# Patient Record
Sex: Female | Born: 1985 | Race: White | Hispanic: No | Marital: Single | Smoking: Current every day smoker
Health system: Southern US, Community
[De-identification: ages and names within clinical notes are randomized; demographics above are authoritative.]

## PROBLEM LIST (undated history)

## (undated) DIAGNOSIS — F419 Anxiety disorder, unspecified: Secondary | ICD-10-CM

## (undated) DIAGNOSIS — K219 Gastro-esophageal reflux disease without esophagitis: Secondary | ICD-10-CM

## (undated) DIAGNOSIS — F329 Major depressive disorder, single episode, unspecified: Secondary | ICD-10-CM

## (undated) DIAGNOSIS — J45909 Unspecified asthma, uncomplicated: Secondary | ICD-10-CM

## (undated) DIAGNOSIS — F32A Depression, unspecified: Secondary | ICD-10-CM

## (undated) HISTORY — PX: TONSILLECTOMY: SUR1361

## (undated) HISTORY — PX: WISDOM TOOTH EXTRACTION: SHX21

## (undated) HISTORY — PX: BACK SURGERY: SHX140

---

## 2005-03-21 ENCOUNTER — Emergency Department (HOSPITAL_COMMUNITY): Admission: EM | Admit: 2005-03-21 | Discharge: 2005-03-21 | Payer: Self-pay | Admitting: Emergency Medicine

## 2005-03-23 ENCOUNTER — Emergency Department (HOSPITAL_COMMUNITY): Admission: EM | Admit: 2005-03-23 | Discharge: 2005-03-23 | Payer: Self-pay | Admitting: Emergency Medicine

## 2005-04-10 ENCOUNTER — Ambulatory Visit (HOSPITAL_COMMUNITY): Admission: RE | Admit: 2005-04-10 | Discharge: 2005-04-11 | Payer: Self-pay | Admitting: Neurosurgery

## 2005-08-30 ENCOUNTER — Emergency Department (HOSPITAL_COMMUNITY): Admission: EM | Admit: 2005-08-30 | Discharge: 2005-08-30 | Payer: Self-pay | Admitting: Emergency Medicine

## 2006-02-17 ENCOUNTER — Emergency Department (HOSPITAL_COMMUNITY): Admission: EM | Admit: 2006-02-17 | Discharge: 2006-02-17 | Payer: Self-pay | Admitting: Emergency Medicine

## 2009-03-30 ENCOUNTER — Inpatient Hospital Stay: Payer: Self-pay | Admitting: Unknown Physician Specialty

## 2014-06-21 ENCOUNTER — Encounter (HOSPITAL_COMMUNITY): Payer: Self-pay | Admitting: Emergency Medicine

## 2014-06-21 ENCOUNTER — Emergency Department (HOSPITAL_COMMUNITY)
Admission: EM | Admit: 2014-06-21 | Discharge: 2014-06-21 | Disposition: A | Discharge status: Home / Self Care | Payer: Commercial Managed Care - PPO | Attending: Emergency Medicine | Admitting: Emergency Medicine

## 2014-06-21 DIAGNOSIS — S61452A Open bite of left hand, initial encounter: Secondary | ICD-10-CM

## 2014-06-21 DIAGNOSIS — W540XXA Bitten by dog, initial encounter: Secondary | ICD-10-CM | POA: Insufficient documentation

## 2014-06-21 DIAGNOSIS — F17200 Nicotine dependence, unspecified, uncomplicated: Secondary | ICD-10-CM | POA: Insufficient documentation

## 2014-06-21 DIAGNOSIS — Z23 Encounter for immunization: Secondary | ICD-10-CM | POA: Insufficient documentation

## 2014-06-21 DIAGNOSIS — S01409A Unspecified open wound of unspecified cheek and temporomandibular area, initial encounter: Secondary | ICD-10-CM | POA: Insufficient documentation

## 2014-06-21 DIAGNOSIS — S0185XA Open bite of other part of head, initial encounter: Secondary | ICD-10-CM

## 2014-06-21 DIAGNOSIS — Z880 Allergy status to penicillin: Secondary | ICD-10-CM | POA: Insufficient documentation

## 2014-06-21 DIAGNOSIS — Y929 Unspecified place or not applicable: Secondary | ICD-10-CM | POA: Insufficient documentation

## 2014-06-21 DIAGNOSIS — Y9389 Activity, other specified: Secondary | ICD-10-CM | POA: Insufficient documentation

## 2014-06-21 DIAGNOSIS — S61409A Unspecified open wound of unspecified hand, initial encounter: Secondary | ICD-10-CM | POA: Insufficient documentation

## 2014-06-21 MED ORDER — POVIDONE-IODINE 10 % EX SOLN
CUTANEOUS | Status: AC
  Filled 2014-06-21: qty 118

## 2014-06-21 MED ORDER — LIDOCAINE-EPINEPHRINE (PF) 2 %-1:200000 IJ SOLN
INTRAMUSCULAR | Status: AC
  Administered 2014-06-21: 20 mL
  Filled 2014-06-21: qty 20

## 2014-06-21 MED ORDER — TETANUS-DIPHTH-ACELL PERTUSSIS 5-2.5-18.5 LF-MCG/0.5 IM SUSP
0.5000 mL | Freq: Once | INTRAMUSCULAR | Status: AC
  Administered 2014-06-21: 0.5 mL via INTRAMUSCULAR
  Filled 2014-06-21: qty 0.5

## 2014-06-21 MED ORDER — CIPROFLOXACIN HCL 250 MG PO TABS
500.0000 mg | ORAL_TABLET | Freq: Once | ORAL | Status: DC
  Filled 2014-06-21: qty 2

## 2014-06-21 MED ORDER — OXYCODONE-ACETAMINOPHEN 5-325 MG PO TABS: 2.0000 | ORAL_TABLET | ORAL | Status: DC | PRN

## 2014-06-21 MED ORDER — CIPROFLOXACIN HCL 500 MG PO TABS: 500.0000 mg | ORAL_TABLET | Freq: Two times a day (BID) | ORAL | Status: DC

## 2014-06-21 MED ORDER — CLINDAMYCIN HCL 150 MG PO CAPS
300.0000 mg | ORAL_CAPSULE | Freq: Once | ORAL | Status: DC
  Filled 2014-06-21: qty 2

## 2014-06-21 MED ORDER — LIDOCAINE-EPINEPHRINE (PF) 2 %-1:200000 IJ SOLN
20.0000 mL | Freq: Once | INTRAMUSCULAR | Status: AC
  Administered 2014-06-21: 20 mL

## 2014-06-21 MED ORDER — CIPROFLOXACIN HCL 250 MG PO TABS
500.0000 mg | ORAL_TABLET | Freq: Once | ORAL | Status: AC
  Administered 2014-06-21: 500 mg via ORAL
  Filled 2014-06-21: qty 2

## 2014-06-21 MED ORDER — CLINDAMYCIN HCL 150 MG PO CAPS
300.0000 mg | ORAL_CAPSULE | Freq: Once | ORAL | Status: AC
  Administered 2014-06-21: 300 mg via ORAL
  Filled 2014-06-21: qty 2

## 2014-06-21 MED ORDER — CLINDAMYCIN HCL 300 MG PO CAPS: 300.0000 mg | ORAL_CAPSULE | Freq: Four times a day (QID) | ORAL | Status: DC

## 2014-06-21 MED ORDER — METRONIDAZOLE 500 MG PO TABS: 500.0000 mg | ORAL_TABLET | Freq: Once | ORAL | Status: DC

## 2014-06-21 NOTE — ED Provider Notes (Signed)
CSN: [id number]     Arrival date & time 06/21/14  1920 History  This chart was scribed for Donnetta Hutching, MD by Milly Jakob, ED Scribe. The patient was seen in room APA17/APA17. Patient's care was started at 8:00 PM.     Chief Complaint  Patient presents with  . Animal Bite   The history is provided by the patient. No language interpreter was used.   HPI Comments: JOURI THREAT is a 28 y.o. female who presents to the Emergency Department complaining of a dog bite onset 1 hour ago. She reports that she has a elliptical laceration on her right cheek, two minor lacerations minor to her right eye, and two miner lacerations on the 2nd and 3rd digits of her left hand. She reports that her Tetanus is not UTD. She denies any chronic illness.   History reviewed. No pertinent past medical history. Past Surgical History  Procedure Laterality Date  . Back surgery     No family history on file. History  Substance Use Topics  . Smoking status: Current Every Day Smoker  . Smokeless tobacco: Not on file  . Alcohol Use: Yes   OB History   Grav Para Term Preterm Abortions TAB SAB Ect Mult Living                 Review of Systems    Allergies  Penicillins  Home Medications   Prior to Admission medications   Medication Sig Start Date End Date Taking? Authorizing Provider  ciprofloxacin (CIPRO) 500 MG tablet Take 1 tablet (500 mg total) by mouth 2 (two) times daily. 06/21/14   Donnetta Hutching, MD  clindamycin (CLEOCIN) 300 MG capsule Take 1 capsule (300 mg total) by mouth 4 (four) times daily. X 7 days 06/21/14   Donnetta Hutching, MD  oxyCODONE-acetaminophen (PERCOCET) 5-325 MG per tablet Take 2 tablets by mouth every 4 (four) hours as needed. 06/21/14   Donnetta Hutching, MD   Triage Vitals: BP 144/82  Pulse 114  Resp 18  Ht 5\' 4"  (1.626 m)  Wt 208 lb (94.348 kg)  BMI 35.69 kg/m2  SpO2 99%  LMP 06/07/2014 Physical Exam  Nursing note and vitals reviewed. Constitutional: She is oriented to person,  place, and time. She appears well-developed and well-nourished. No distress.  HENT:  Head: Normocephalic and atraumatic.  Eyes: Conjunctivae and EOM are normal.  Neck: Neck supple. No tracheal deviation present.  Cardiovascular: Normal rate.   Pulmonary/Chest: Effort normal. No respiratory distress.  Musculoskeletal: Normal range of motion.  Neurological: She is alert and oriented to person, place, and time.  Skin: Skin is warm and dry.  Elliptical 2.75 cm laceration inferior to her eye on her right cheek. Minor to her right eye there are 2 semicircular lacerations. On the DIP of her 2nd and 3rd digits on her left hand she has two minor lacerations.   Psychiatric: She has a normal mood and affect. Her behavior is normal.    ED Course  Procedures (including critical care time) DIAGNOSTIC STUDIES: Oxygen Saturation is 99% on room air, normal by my interpretation.    COORDINATION OF CARE: 8:06 PM-Discussed treatment plan which includes laceration repair with pt at bedside and pt agreed to plan.   LACERATION REPAIR PROCEDURE NOTE The patient's identification was confirmed and consent was obtained. This procedure was performed by No att. providers found at 4:10 PM. Site: Elliptical laceration inferior to her eye on her right cheek.  Sterile procedures observed Anesthetic used (type  and amt): 2% Lidocaine w/ Epinephrine 12/1998 Suture type/size: 6-0 / Ethilon  Length: 2.75 cm # of Sutures: 6 Technique: Simple Inturrupted   Complexity: Severe Antibx ointment applied Tetanus ordered Site anesthetized, irrigated with NS extensivley, explored without evidence of foreign body, wound well approximated, site covered with dry, sterile dressing.  Patient tolerated procedure well without complications. Instructions for care discussed verbally and patient provided with additional written instructions for homecare and f/u.  Labs Review Labs Reviewed - No data to display  Imaging Review No  results found.   EKG Interpretation None      MDM   Final diagnoses:  Dog bite of face, initial encounter  Dog bite of left hand, initial encounter    Facial wound closed loosely secondary to infection potential.  This was disc c pt and her mother.  Tetanus, antibiotics, recheck two days.  I personally performed the services described in this documentation, which was scribed in my presence. The recorded information has been reviewed and is accurate.    Donnetta HutchingBrian Aliyyah Riese, MD 06/28/14 [phone]

## 2014-06-21 NOTE — Discharge Instructions (Signed)
Animal Bite Animal bite wounds can get infected. It is important to get proper medical treatment. Ask your doctor if you need a rabies shot. HOME CARE   Follow your doctor's instructions for taking care of your wound.  Only take medicine as told by your doctor.  Take your medicine (antibiotics) as told. Finish them even if you start to feel better.  Keep all doctor visits as told. You may need a tetanus shot if:   You cannot remember when you had your last tetanus shot.  You have never had a tetanus shot.  The injury broke your skin. If you need a tetanus shot and you choose not to have one, you may get tetanus. Sickness from tetanus can be serious. GET HELP RIGHT AWAY IF:   Your wound is warm, red, sore, or puffy (swollen).  You notice yellowish-white fluid (pus) or a bad smell coming from the wound.  You see a red line on the skin coming from the wound.  You have a fever, chills, or you feel sick.  You feel sick to your stomach (nauseous), or you throw up (vomit).  Your pain does not go away, or it gets worse.  You have trouble moving the injured part.  You have questions or concerns. MAKE SURE YOU:   Understand these instructions.  Will watch your condition.  Will get help right away if you are not doing well or get worse. Document Released: 11/23/2005 Document Revised: 02/15/2012 Document Reviewed: 07/15/2011 [facility] Patient Information 2015 [place], [place]. This information is not intended to replace advice given to you by your health care provider. Make sure you discuss any questions you have with your health care provider.   Recheck here in Saturday morning. Leave dressing on until then. There is a possibility for infection. Return sooner for fever or chills. 2 different antibiotics: Cipro 500 mg twice a day and clindamycin 300 mg 4 times a day. Take a dose of the chin the middle the night. Pain medication.

## 2014-06-21 NOTE — ED Notes (Signed)
Pt's is owner of dog that got in a fight with mother's dog, pt went to pull her dog off and her dog came around to pt's face, lace noted under right eye and also states bit to fingers of left hand, pt states dog is up to date on shots, [place] [facility] [place] be [place]

## 2014-06-21 NOTE — ED Notes (Signed)
Dogs got into a fight and I got in the middle of them. Dog bite to the face and left hand. Dogs live in the same home and have been vaccinated per pt. Laceration noted under right eye in cheek area. Puncture wound noted beside right eyebrow. Lacerations noted to left 2nd and 3rd fingers.

## 2014-06-21 NOTE — ED Notes (Signed)
Discharge instructions given and reviewed with patient.  Prescriptions given for Percocet, Cipro and Clindamycin; effects and use explained.  Patient verbalized understanding of sedating effects of Percocet and to complete all antibiotic.  Patient also verbalized understanding to return on Saturday for a wound check.

## 2014-06-23 ENCOUNTER — Emergency Department (HOSPITAL_COMMUNITY)
Admission: EM | Admit: 2014-06-23 | Discharge: 2014-06-23 | Disposition: A | Discharge status: Home / Self Care | Payer: Commercial Managed Care - PPO | Attending: Emergency Medicine | Admitting: Emergency Medicine

## 2014-06-23 ENCOUNTER — Encounter (HOSPITAL_COMMUNITY): Payer: Self-pay | Admitting: Emergency Medicine

## 2014-06-23 DIAGNOSIS — F17200 Nicotine dependence, unspecified, uncomplicated: Secondary | ICD-10-CM | POA: Insufficient documentation

## 2014-06-23 DIAGNOSIS — Z4801 Encounter for change or removal of surgical wound dressing: Secondary | ICD-10-CM | POA: Insufficient documentation

## 2014-06-23 DIAGNOSIS — Z792 Long term (current) use of antibiotics: Secondary | ICD-10-CM | POA: Insufficient documentation

## 2014-06-23 DIAGNOSIS — S0185XD Open bite of other part of head, subsequent encounter: Secondary | ICD-10-CM

## 2014-06-23 DIAGNOSIS — Z880 Allergy status to penicillin: Secondary | ICD-10-CM | POA: Insufficient documentation

## 2014-06-23 NOTE — ED Notes (Signed)
Patient with no complaints at this time. Respirations even and unlabored. Skin warm/dry. Discharge instructions reviewed with patient at this time. Patient given opportunity to voice concerns/ask questions. Patient discharged at this time and left Emergency Department with steady gait.   

## 2014-06-23 NOTE — ED Notes (Signed)
Pt presents today for recheck for wound to right cheek, pt states that she was received a laceration while breaking up a dog fight, denies any problems with stitches, states that the area is "looking better", pt has sweling, bruising noted around right eye.

## 2014-06-23 NOTE — ED Provider Notes (Signed)
CSN: [id number]     Arrival date & time 06/23/14  1326 History   First MD Initiated Contact with Patient 06/23/14 1407     Chief Complaint  Patient presents with  . Follow-up     (Consider location/radiation/quality/duration/timing/severity/associated sxs/prior Treatment) HPI Comments: Amanda Morales is a 28 y.o. female who presents to the Emergency Department requesting recheck of a laceration to her right cheek that was secondary to a dog bite.  She states the pain is improving, she denies visual changes, fever, chills, redness or drainage.  She has bruising and swelling of the cheek, but states it is no worse than her previous visit.    The history is provided by the patient.    History reviewed. No pertinent past medical history. Past Surgical History  Procedure Laterality Date  . Back surgery     No family history on file. History  Substance Use Topics  . Smoking status: Current Every Day Smoker  . Smokeless tobacco: Not on file  . Alcohol Use: Yes   OB History   Grav Para Term Preterm Abortions TAB SAB Ect Mult Living                 Review of Systems  Constitutional: Negative for fever and chills.  HENT: Negative for trouble swallowing and voice change.   Musculoskeletal: Negative for arthralgias, back pain, joint swelling and neck pain.  Skin: Positive for wound.       Laceration right face  Neurological: Positive for facial asymmetry. Negative for dizziness, syncope, weakness, numbness and headaches.  Hematological: Does not bruise/bleed easily.  All other systems reviewed and are negative.     Allergies  Penicillins  Home Medications   Prior to Admission medications   Medication Sig Start Date End Date Taking? Authorizing Provider  ciprofloxacin (CIPRO) 500 MG tablet Take 1 tablet (500 mg total) by mouth 2 (two) times daily. 06/21/14  Yes Donnetta Hutching, MD  clindamycin (CLEOCIN) 300 MG capsule Take 1 capsule (300 mg total) by mouth 4 (four) times daily. X  7 days 06/21/14  Yes Donnetta Hutching, MD  oxyCODONE-acetaminophen (PERCOCET) 5-325 MG per tablet Take 2 tablets by mouth every 4 (four) hours as needed. 06/21/14  Yes Donnetta Hutching, MD   BP 125/75  Pulse 80  Temp(Src) 98.1 F (36.7 C) (Oral)  Resp 16  SpO2 99%  LMP 06/07/2014 Physical Exam  Nursing note and vitals reviewed. Constitutional: She is oriented to person, place, and time. She appears well-developed and well-nourished. No distress.  HENT:  Head: Normocephalic and atraumatic.    Right Ear: Tympanic membrane and ear canal normal.  Left Ear: Tympanic membrane and ear canal normal.  Semi circular laceration to the right cheek.  Sutures intact.  Wound appears to be healing well.  No erythema or drainage present.  Pt has mild bruising to the right periorbital region.    Eyes: EOM are normal. Pupils are equal, round, and reactive to light.  Neck: Normal range of motion. Neck supple.  Cardiovascular: Normal rate, regular rhythm, normal heart sounds and intact distal pulses.   No murmur heard. Pulmonary/Chest: Effort normal and breath sounds normal. No respiratory distress.  Musculoskeletal: Normal range of motion. She exhibits no edema and no tenderness.  Small scratches to the left second and third fingers.  Wounds appear well approximated and clean.  No erythema or edema.  Pt has full ROM of the fingers.  Distal sensation intact  Lymphadenopathy:    She has no  cervical adenopathy.  Neurological: She is alert and oriented to person, place, and time. She exhibits normal muscle tone. Coordination normal.  Skin: Skin is warm. Laceration noted.  See HENT exam    ED Course  Procedures (including critical care time) Labs Review Labs Reviewed - No data to display  Imaging Review No results found.   EKG Interpretation None      MDM   Final diagnoses:  Animal bite of face, subsequent encounter    Previous ed chart reviewed.  Laceration to right cheek appears to be healing well.   No significant edema.  Pain improved.  No concerning sx's for infection at this time.  Pt is currently taking cipro and clindamycin.  She appears stable for d/c and agrees to f/u with her PMD if needed or to return here      Myrah Strawderman L. Trisha Mangleriplett, PA-C 06/24/14 1802

## 2014-06-25 MED FILL — Oxycodone w/ Acetaminophen Tab 5-325 MG: ORAL | Qty: 6 | Status: AC

## 2014-06-25 NOTE — ED Provider Notes (Signed)
History/physical exam/procedure(s) were performed by non-physician practitioner and as supervising physician I was immediately available for consultation/collaboration. I have reviewed all notes and am in agreement with care and plan.   Saharsh Sterling S Staphany Ditton, MD 06/25/14 1458 

## 2015-09-12 ENCOUNTER — Emergency Department (HOSPITAL_COMMUNITY)
Admission: EM | Admit: 2015-09-12 | Discharge: 2015-09-12 | Disposition: A | Discharge status: Home / Self Care | Payer: Commercial Managed Care - PPO | Attending: Emergency Medicine | Admitting: Emergency Medicine

## 2015-09-12 ENCOUNTER — Encounter (HOSPITAL_COMMUNITY): Payer: Self-pay | Admitting: *Deleted

## 2015-09-12 ENCOUNTER — Emergency Department (HOSPITAL_COMMUNITY): Payer: Commercial Managed Care - PPO

## 2015-09-12 DIAGNOSIS — X58XXXA Exposure to other specified factors, initial encounter: Secondary | ICD-10-CM | POA: Insufficient documentation

## 2015-09-12 DIAGNOSIS — Z880 Allergy status to penicillin: Secondary | ICD-10-CM | POA: Diagnosis not present

## 2015-09-12 DIAGNOSIS — S46912A Strain of unspecified muscle, fascia and tendon at shoulder and upper arm level, left arm, initial encounter: Secondary | ICD-10-CM | POA: Insufficient documentation

## 2015-09-12 DIAGNOSIS — Y9389 Activity, other specified: Secondary | ICD-10-CM | POA: Diagnosis not present

## 2015-09-12 DIAGNOSIS — Z720 Tobacco use: Secondary | ICD-10-CM | POA: Diagnosis not present

## 2015-09-12 DIAGNOSIS — Y9289 Other specified places as the place of occurrence of the external cause: Secondary | ICD-10-CM | POA: Diagnosis not present

## 2015-09-12 DIAGNOSIS — S4992XA Unspecified injury of left shoulder and upper arm, initial encounter: Secondary | ICD-10-CM | POA: Diagnosis present

## 2015-09-12 DIAGNOSIS — Y998 Other external cause status: Secondary | ICD-10-CM | POA: Insufficient documentation

## 2015-09-12 MED ORDER — HYDROCODONE-ACETAMINOPHEN 5-325 MG PO TABS: ORAL_TABLET | ORAL | Status: DC

## 2015-09-12 NOTE — ED Notes (Signed)
Patient c/o pain in left shoulder x 2 weeks. 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

## 2015-09-14 NOTE — ED Provider Notes (Signed)
CSN: [id number]     Arrival date & time 09/12/15  1024 History   First MD Initiated Contact with Patient 09/12/15 1102     Chief Complaint  Patient presents with  . Shoulder Pain     (Consider location/radiation/quality/duration/timing/severity/associated sxs/prior Treatment) HPI   Amanda Morales is a 29 y.o. female who presents to the Emergency Department complaining of left shoulder for 2 weeks.  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  Thinks she has "pulled a muscle"  She has been taking OTC medications without relief.  Pain is worse with left arm movement and improves with arm  at rest.  She denies numbness or weakness, neck or chest pain, fever, headaches, or swelling.   History reviewed. No pertinent past medical history. Past Surgical History  Procedure Laterality Date  . Back surgery     History reviewed. No pertinent family history. Social History  Substance Use Topics  . Smoking status: Current Every Day Smoker  . Smokeless tobacco: None  . Alcohol Use: Yes   OB History    No data available     Review of Systems  Constitutional: Negative for fever and chills.  Gastrointestinal: Negative for nausea, vomiting and abdominal pain.  Genitourinary: Negative for dysuria and difficulty urinating.  Musculoskeletal: Positive for arthralgias (left shoulder). Negative for joint swelling, neck pain and neck stiffness.  Skin: Negative for color change and wound.  Neurological: Negative for weakness, numbness and headaches.  All other systems reviewed and are negative.     Allergies  Penicillins  Home Medications   Prior to Admission medications   Medication Sig Start Date End Date Taking? Authorizing Provider  ibuprofen (ADVIL,MOTRIN) 200 MG tablet Take 800 mg by mouth every 6 (six) hours as needed.   Yes Historical Provider, MD  HYDROcodone-acetaminophen (NORCO/VICODIN) 5-325 MG tablet Take one-two tabs po q 4-6 hrs prn pain  09/12/15   Makina Skow, PA-C   BP 160/88 mmHg  Pulse 74  Temp(Src) 98.1 F (36.7 C) (Oral)  Resp 16  Ht  (1.575 m)  Wt 215 lb (97.523 kg)  BMI 39.31 kg/m2  SpO2 100% Physical Exam  Constitutional: She is oriented to person, place, and time. She appears well-developed and well-nourished. No distress.  HENT:  Head: Normocephalic and atraumatic.  Neck: Normal range of motion. Neck supple. No thyromegaly present.  Cardiovascular: Normal rate, regular rhythm, normal heart sounds and intact distal pulses.   No murmur heard. Pulmonary/Chest: Effort normal and breath sounds normal. No respiratory distress. She exhibits no tenderness.  Musculoskeletal: She exhibits tenderness. She exhibits no edema.  ttp of the left anterior shoulder.  Pain with abduction of the left arm and rotation of the shoulder.  Radial pulse is brisk, distal sensation intact, CR< 2 sec. Grip strength is strong and symmetrical.   No abrasions, edema , erythema or step-off deformity of the joint.   Lymphadenopathy:    She has no cervical adenopathy.  Neurological: She is alert and oriented to person, place, and time. She has normal strength. No sensory deficit. She exhibits normal muscle tone. Coordination normal.  Skin: Skin is warm and dry.  Nursing note and vitals reviewed.   ED Course  Procedures (including critical care time) Labs Review Labs Reviewed - No data to display  Imaging Review Dg Shoulder Left  09/12/2015   CLINICAL DATA:  Left shoulder pain for 2 weeks without known injury.  EXAM: LEFT SHOULDER -  2+ VIEW  COMPARISON:  None.  FINDINGS: There is no evidence of fracture or dislocation. There is no evidence of arthropathy or other focal bone abnormality. Soft tissues are unremarkable.  IMPRESSION: Normal left shoulder.   Electronically Signed   By: Lupita Raider, M.D.   On: 09/12/2015 11:26   I have personally reviewed and evaluated these images and lab results as part of my medical  decision-making.   EKG Interpretation None      MDM   Final diagnoses:  Shoulder strain, left, initial encounter    Pt is well appearing.  Non-toxic appearing.  Sx's c/w shoulder strain. No cervical tenderness.   No concerning sx's for septic joint.  NV intact.  She agrees to symptomatic tx , continue NSAID and orthopedic f/u in one week if not improving.      Pauline Aus, PA-C 09/14/15 2155  Lavera Guise, MD 09/16/15 [phone]

## 2016-05-03 ENCOUNTER — Encounter: Payer: Self-pay | Admitting: Emergency Medicine

## 2016-05-03 ENCOUNTER — Emergency Department
Admission: EM | Admit: 2016-05-03 | Discharge: 2016-05-04 | Disposition: A | Discharge status: Home / Self Care | Payer: Commercial Managed Care - PPO | Attending: Emergency Medicine | Admitting: Emergency Medicine

## 2016-05-03 ENCOUNTER — Emergency Department: Payer: Commercial Managed Care - PPO

## 2016-05-03 DIAGNOSIS — F17210 Nicotine dependence, cigarettes, uncomplicated: Secondary | ICD-10-CM | POA: Insufficient documentation

## 2016-05-03 DIAGNOSIS — N3001 Acute cystitis with hematuria: Secondary | ICD-10-CM | POA: Diagnosis not present

## 2016-05-03 DIAGNOSIS — R109 Unspecified abdominal pain: Secondary | ICD-10-CM

## 2016-05-03 LAB — POCT PREGNANCY, URINE: PREG TEST UR: NEGATIVE

## 2016-05-03 LAB — URINALYSIS COMPLETE WITH MICROSCOPIC (ARMC ONLY)
BACTERIA UA: NONE SEEN
Bilirubin Urine: NEGATIVE
Glucose, UA: NEGATIVE mg/dL
Nitrite: NEGATIVE
PH: 5 (ref 5.0–8.0)
Protein, ur: NEGATIVE mg/dL
Specific Gravity, Urine: 1.023 (ref 1.005–1.030)

## 2016-05-03 MED ORDER — OXYCODONE-ACETAMINOPHEN 5-325 MG PO TABS
1.0000 | ORAL_TABLET | Freq: Once | ORAL | Status: AC
  Administered 2016-05-03: 1 via ORAL
  Filled 2016-05-03: qty 1

## 2016-05-03 MED ORDER — LIDOCAINE HCL (PF) 1 % IJ SOLN: 2.1000 mL | Freq: Once | INTRAMUSCULAR | Status: DC

## 2016-05-03 MED ORDER — DIAZEPAM 2 MG PO TABS
2.0000 mg | ORAL_TABLET | Freq: Once | ORAL | Status: AC
  Administered 2016-05-03: 2 mg via ORAL
  Filled 2016-05-03: qty 1

## 2016-05-03 MED ORDER — CEFTRIAXONE SODIUM 1 G IJ SOLR: 1.0000 g | INTRAMUSCULAR | Status: DC

## 2016-05-03 NOTE — ED Notes (Signed)
Patient transported to CT 

## 2016-05-03 NOTE — ED Notes (Signed)
POC urine NEGATIVE 

## 2016-05-03 NOTE — ED Notes (Addendum)
Pt arrived from work where she had to leave due to unbearable pain that began at 330. Pain began after waking up and is not worse with movement. Denies injury. Describes pain as a spasm that is localized to left lower back.

## 2016-05-03 NOTE — ED Provider Notes (Signed)
[facility] Emergency Department Provider Note ____________________________________________  Time seen: Approximately 10:33 PM  I have reviewed the triage vital signs and the nursing notes.   HISTORY  Chief Complaint Back Pain    HPI Amanda Morales is a 30 y.o. female who presents to the emergency department for evaluation of back pain. She states that she developed severe back pain this afternoon after awakening. She denies injury. She reports having back surgery in the past due to herniated discs. She has taken ibuprofen without relief. She denies radiation of the pain or numbness/tingling in the lower extremities. Pain is described as a spasm.   History reviewed. No pertinent past medical history.  There are no active problems to display for this patient.   Past Surgical History  Procedure Laterality Date  . Back surgery      Current Outpatient Rx  Name  Route  Sig  Dispense  Refill  . ciprofloxacin (CIPRO) 500 MG tablet   Oral   Take 1 tablet (500 mg total) by mouth 2 (two) times daily.   20 tablet   0   . oxyCODONE-acetaminophen (ROXICET) 5-325 MG tablet   Oral   Take 1 tablet by mouth every 6 (six) hours as needed.   9 tablet   0     Allergies Penicillins  No family history on file.  Social History Social History  Substance Use Topics  . Smoking status: Current Every Day Smoker -- 1.00 packs/day for 15 years    Types: Cigarettes  . Smokeless tobacco: None  . Alcohol Use: Yes    Review of Systems Constitutional: No recent illness. Cardiovascular: Denies chest pain or palpitations. Respiratory: Denies shortness of breath. Musculoskeletal: Pain in left lower back.  Skin: Negative for rash, wound, lesion. Neurological: Negative for focal weakness or numbness.  ____________________________________________   PHYSICAL EXAM:  VITAL SIGNS: ED Triage Vitals  Enc Vitals Group     BP 05/03/16 2228 146/97 mmHg     Pulse Rate  05/03/16 2228 85     Resp 05/03/16 2228 20     Temp 05/03/16 2228 98.2 F (36.8 C)     Temp Source 05/03/16 2228 Oral     SpO2 05/03/16 2228 98 %     Weight 05/03/16 2228 203 lb (92.08 kg)     Height 05/03/16 2228  (1.575 m)     Head Cir --      Peak Flow --      Pain Score 05/03/16 2228 9     Pain Loc --      Pain Edu? --      Excl. in GC? --     Constitutional: Alert and oriented. Well appearing and in no acute distress. Eyes: Conjunctivae are normal. EOMI. Head: Atraumatic. Neck: No stridor.  Respiratory: Normal respiratory effort.   Musculoskeletal: Limited ROM of back due to pain. Pain mainly on left in the lumbar area.  Neurologic:  Normal speech and language. No gross focal neurologic deficits are appreciated. Speech is normal. No gait instability. Skin:  Skin is warm, dry and intact. Atraumatic. Psychiatric: Mood and affect are normal. Speech and behavior are normal.  ____________________________________________   LABS (all labs ordered are listed, but only abnormal results are displayed)  Labs Reviewed  URINALYSIS COMPLETEWITH MICROSCOPIC ([facility] ONLY) - Abnormal; Notable for the following:    Color, Urine YELLOW (*)    APPearance CLEAR (*)    Ketones, ur 1+ (*)    Hgb urine  dipstick 2+ (*)    Leukocytes, UA 1+ (*)    Squamous Epithelial / LPF 0-5 (*)    All other components within normal limits  URINE CULTURE  POC URINE PREG, ED  POCT PREGNANCY, URINE   ____________________________________________  RADIOLOGY  No acute abnormality in the abdomen and pelvis, specifically no renal stones or obstructive uropathy per CT stone protocol. Hepatic steatosis is noted. ____________________________________________   PROCEDURES  Procedure(s) performed: None   ____________________________________________   INITIAL IMPRESSION / ASSESSMENT AND PLAN / ED COURSE  Pertinent labs & imaging results that were available during my care of the patient were reviewed  by me and considered in my medical decision making (see chart for details).  Patient was given IM Dilaudid and ciprofloxacin 500 mg by mouth prior to discharge. She is to call and schedule a follow-up appointment with urology if symptoms do not improve over the next 2-3 days. She was encouraged to return to the emergency room for symptoms change or worsen if unable schedule an appointment. ____________________________________________   FINAL CLINICAL IMPRESSION(S) / ED DIAGNOSES  Final diagnoses:  Acute left flank pain  Acute cystitis with hematuria       Chinita PesterCari B Jonae Renshaw, FNP 05/04/16 1903  Jeanmarie PlantJames A McShane, MD 05/08/16 [phone]

## 2016-05-04 MED ORDER — OXYCODONE-ACETAMINOPHEN 5-325 MG PO TABS: 1.0000 | ORAL_TABLET | Freq: Four times a day (QID) | ORAL | Status: DC | PRN

## 2016-05-04 MED ORDER — HYDROMORPHONE HCL 1 MG/ML IJ SOLN
1.0000 mg | Freq: Once | INTRAMUSCULAR | Status: AC
  Administered 2016-05-04: 1 mg via INTRAMUSCULAR
  Filled 2016-05-04: qty 1

## 2016-05-04 MED ORDER — CIPROFLOXACIN HCL 500 MG PO TABS
500.0000 mg | ORAL_TABLET | Freq: Once | ORAL | Status: AC
  Administered 2016-05-04: 500 mg via ORAL
  Filled 2016-05-04: qty 1

## 2016-05-04 MED ORDER — CIPROFLOXACIN HCL 500 MG PO TABS: 500.0000 mg | ORAL_TABLET | Freq: Two times a day (BID) | ORAL | Status: DC

## 2016-05-04 NOTE — Discharge Instructions (Signed)
Flank Pain °Flank pain refers to pain that is located on the side of the body between the upper abdomen and the back. The pain may occur over a short period of time (acute) or may be long-term or reoccurring (chronic). It may be mild or severe. Flank pain can be caused by many things. °CAUSES  °Some of the more common causes of flank pain include: °· Muscle strains.   °· Muscle spasms.   °· A disease of your spine (vertebral disk disease).   °· A lung infection (pneumonia).   °· Fluid around your lungs (pulmonary edema).   °· A kidney infection.   °· Kidney stones.   °· A very painful skin rash caused by the chickenpox virus (shingles).   °· Gallbladder disease.   °HOME CARE INSTRUCTIONS  °Home care will depend on the cause of your pain. In general, °· Rest as directed by your caregiver. °· Drink enough fluids to keep your urine clear or pale yellow. °· Only take over-the-counter or prescription medicines as directed by your caregiver. Some medicines may help relieve the pain. °· Tell your caregiver about any changes in your pain. °· Follow up with your caregiver as directed. °SEEK IMMEDIATE MEDICAL CARE IF:  °· Your pain is not controlled with medicine.   °· You have new or worsening symptoms. °· Your pain increases.   °· You have abdominal pain.   °· You have shortness of breath.   °· You have persistent nausea or vomiting.   °· You have swelling in your abdomen.   °· You feel faint or pass out.   °· You have blood in your urine. °· You have a fever or persistent symptoms for more than 2-3 days. °· You have a fever and your symptoms suddenly get worse. °MAKE SURE YOU:  °· Understand these instructions. °· Will watch your condition. °· Will get help right away if you are not doing well or get worse. °  °This information is not intended to replace advice given to you by your health care provider. Make sure you discuss any questions you have with your health care provider. °  °Document Released: 01/14/2006 Document  Revised: 08/17/2012 Document Reviewed: 07/07/2012 °Elsevier Interactive Patient Education ©2016 [employer] ° °

## 2016-05-05 LAB — URINE CULTURE: Special Requests: NORMAL

## 2018-01-08 ENCOUNTER — Encounter: Payer: Self-pay | Admitting: Emergency Medicine

## 2018-01-08 ENCOUNTER — Emergency Department
Admission: EM | Admit: 2018-01-08 | Discharge: 2018-01-08 | Disposition: A | Discharge status: Home / Self Care | Payer: Worker's Compensation | Attending: Emergency Medicine | Admitting: Emergency Medicine

## 2018-01-08 ENCOUNTER — Emergency Department: Payer: Worker's Compensation

## 2018-01-08 DIAGNOSIS — Z79899 Other long term (current) drug therapy: Secondary | ICD-10-CM | POA: Insufficient documentation

## 2018-01-08 DIAGNOSIS — M545 Low back pain: Secondary | ICD-10-CM | POA: Diagnosis not present

## 2018-01-08 DIAGNOSIS — F17210 Nicotine dependence, cigarettes, uncomplicated: Secondary | ICD-10-CM | POA: Diagnosis not present

## 2018-01-08 MED ORDER — CYCLOBENZAPRINE HCL 10 MG PO TABS
10.0000 mg | ORAL_TABLET | Freq: Once | ORAL | Status: AC
  Administered 2018-01-08: 10 mg via ORAL
  Filled 2018-01-08: qty 1

## 2018-01-08 MED ORDER — CYCLOBENZAPRINE HCL 10 MG PO TABS: 10.0000 mg | ORAL_TABLET | Freq: Three times a day (TID) | ORAL | 0 refills | Status: AC | PRN

## 2018-01-08 MED ORDER — KETOROLAC TROMETHAMINE 10 MG PO TABS: 10.0000 mg | ORAL_TABLET | Freq: Four times a day (QID) | ORAL | 0 refills | Status: AC | PRN

## 2018-01-08 MED ORDER — KETOROLAC TROMETHAMINE 30 MG/ML IJ SOLN
30.0000 mg | Freq: Once | INTRAMUSCULAR | Status: AC
  Administered 2018-01-08: 30 mg via INTRAMUSCULAR
  Filled 2018-01-08: qty 1

## 2018-01-08 NOTE — ED Provider Notes (Signed)
[facility] Emergency Department Provider Note  ____________________________________________  Time seen: Approximately 10:13 PM  I have reviewed the triage vital signs and the nursing notes.   HISTORY  Chief Complaint Back Pain and Fall    HPI Amanda Morales is a 32 y.o. female presents to the emergency department with low back pain and pain in the distribution of the coccyx after slipping and falling at work.  Patient did not hit her head and did not lose consciousness.  She reports radiating pain into the right lower leg.  She denies weakness, radiculopathy or changes in sensation of the lower extremities.  No incontinence.  Patient has a history of chronic low back pain.  No alleviating medications have been attempted.   History reviewed. No pertinent past medical history.  There are no active problems to display for this patient.   Past Surgical History:  Procedure Laterality Date  . BACK SURGERY      Prior to Admission medications   Medication Sig Start Date End Date Taking? Authorizing Provider  ciprofloxacin (CIPRO) 500 MG tablet Take 1 tablet (500 mg total) by mouth 2 (two) times daily. 05/04/16   Triplett, Rulon Eisenmenger B, FNP  cyclobenzaprine (FLEXERIL) 10 MG tablet Take 1 tablet (10 mg total) by mouth 3 (three) times daily as needed for up to 5 days. 01/08/18 01/13/18  Orvil Feil, PA-C  ketorolac (TORADOL) 10 MG tablet Take 1 tablet (10 mg total) by mouth every 6 (six) hours as needed for up to 5 days. 01/08/18 01/13/18  Orvil Feil, PA-C  oxyCODONE-acetaminophen (ROXICET) 5-325 MG tablet Take 1 tablet by mouth every 6 (six) hours as needed. 05/04/16   Kem Boroughs B, FNP    Allergies Penicillins  No family history on file.  Social History Social History   Tobacco Use  . Smoking status: Current Every Day Smoker    Packs/day: 1.00    Years: 15.00    Pack years: 15.00    Types: Cigarettes  . Smokeless tobacco: Never Used  Substance Use  Topics  . Alcohol use: Yes    Comment: occ  . Drug use: No     Review of Systems  Constitutional: No fever/chills Eyes: No visual changes. No discharge ENT: No upper respiratory complaints. Cardiovascular: no chest pain. Respiratory: no cough. No SOB. Gastrointestinal: No abdominal pain.  No nausea, no vomiting.  No diarrhea.  No constipation. Genitourinary: Negative for dysuria. No hematuria Musculoskeletal: Patient has low back pain.  Skin: Negative for rash, abrasions, lacerations, ecchymosis. Neurological: Negative for headaches, focal weakness or numbness.   ____________________________________________   PHYSICAL EXAM:  VITAL SIGNS: ED Triage Vitals [01/08/18 2104]  Enc Vitals Group     BP (!) 152/91     Pulse Rate 82     Resp 18     Temp      Temp src      SpO2 97 %     Weight 210 lb (95.3 kg)     Height 5\' 3"  (1.6 m)     Head Circumference      Peak Flow      Pain Score 7     Pain Loc      Pain Edu?      Excl. in GC?      Constitutional: Alert and oriented. Well appearing and in no acute distress. Eyes: Conjunctivae are normal. PERRL. EOMI. Head: Atraumatic. Cardiovascular: Normal rate, regular rhythm. Normal S1 and S2.  Good peripheral circulation. Respiratory:  Normal respiratory effort without tachypnea or retractions. Lungs CTAB. Good air entry to the bases with no decreased or absent breath sounds. Gastrointestinal: Bowel sounds 4 quadrants. Soft and nontender to palpation. No guarding or rigidity. No palpable masses. No distention. No CVA tenderness. Musculoskeletal: Patient has tenderness to palpation along the lumbar paraspinal muscles.  Negative straight leg raise bilaterally.  Palpable dorsalis pedis pulse bilaterally and symmetrically. Neurologic:  Normal speech and language. No gross focal neurologic deficits are appreciated.  Skin:  Skin is warm, dry and intact. No rash noted. Psychiatric: Mood and affect are normal. Speech and behavior are  normal. Patient exhibits appropriate insight and judgement.   ____________________________________________   LABS (all labs ordered are listed, but only abnormal results are displayed)  Labs Reviewed  POC URINE PREG, ED   ____________________________________________  EKG   ____________________________________________  RADIOLOGY Geraldo PitterI, Kavita Bartl M Davielle Lingelbach, personally viewed and evaluated these images (plain radiographs) as part of my medical decision making, as well as reviewing the written report by the radiologist.    Dg Lumbar Spine 2-3 Views  Result Date: 01/08/2018 CLINICAL DATA:  Low back pain after fall at work. EXAM: LUMBAR SPINE - 2-3 VIEW COMPARISON:  CT scan of May 03, 2016. FINDINGS: No fracture or spondylolisthesis is noted. Moderate degenerative disc disease is noted at L3-4 and L4-5. Anterior osteophyte formation is noted at L4-5. IMPRESSION: Multilevel degenerative disc disease. No acute abnormality seen in the lumbar spine. Electronically Signed   By: Lupita RaiderJames  Green Jr, M.D.   On: 01/08/2018 22:23   Dg Sacrum/coccyx  Result Date: 01/08/2018 CLINICAL DATA:  Low back pain after fall at work. EXAM: SACRUM AND COCCYX - 2+ VIEW COMPARISON:  None. FINDINGS: There is no evidence of fracture or other focal bone lesions. Sacroiliac joints appear normal. IMPRESSION: Normal sacrum and coccyx. Electronically Signed   By: Lupita RaiderJames  Green Jr, M.D.   On: 01/08/2018 22:25    ____________________________________________    PROCEDURES  Procedure(s) performed:    Procedures    Medications  ketorolac (TORADOL) 30 MG/ML injection 30 mg (30 mg Intramuscular Given 01/08/18 2244)  cyclobenzaprine (FLEXERIL) tablet 10 mg (10 mg Oral Given 01/08/18 2245)     ____________________________________________   INITIAL IMPRESSION / ASSESSMENT AND PLAN / ED COURSE  Pertinent labs & imaging results that were available during my care of the patient were reviewed by me and considered in my medical  decision making (see chart for details).  Review of the [place] CSRS was performed in accordance of the [facility] prior to dispensing any controlled drugs.     Assessment and Plan:  Back pain Patient presents to the emergency department with back pain after experiencing a fall.  Differential diagnosis included fracture, contusion, lumbar strain and herniated disc.  X-ray examination revealed no acute fractures or bony abnormalities.  Patient was treated empirically with Flexeril and Toradol in the emergency department.  She was discharged with Flexeril and Toradol.  Vital signs are reassuring aside from hypertension.  All patient questions were answered.    ____________________________________________  FINAL CLINICAL IMPRESSION(S) / ED DIAGNOSES  Final diagnoses:  Acute low back pain, unspecified back pain laterality, with sciatica presence unspecified      NEW MEDICATIONS STARTED DURING THIS VISIT:  ED Discharge Orders        Ordered    ketorolac (TORADOL) 10 MG tablet  Every 6 hours PRN     01/08/18 2257    cyclobenzaprine (FLEXERIL) 10 MG tablet  3 times daily PRN  01/08/18 2257          This chart was dictated using voice recognition software/Dragon. Despite best efforts to proofread, errors can occur which can change the meaning. Any change was purely unintentional.    Orvil Feil, PA-C 01/08/18 2322    Emily Filbert, MD 01/09/18 1059

## 2018-01-08 NOTE — ED Notes (Signed)
wc completed, urine delivered to the lab. LA

## 2018-01-08 NOTE — ED Triage Notes (Signed)
Patient states that she was at work and slipped on water. Patient states that she landed on her back. Patient with complaint of lower back pain. Patient states that she has a history of back surgery for four herniated disc.

## 2018-01-08 NOTE — ED Notes (Addendum)
Pt reports slipping and falling at work earlier today and fell onto back, denies head strike or LOC, reports prior back injury with back surgery at age 32  Workman's comp case already initiated

## 2018-05-23 ENCOUNTER — Other Ambulatory Visit: Payer: Self-pay

## 2018-05-23 ENCOUNTER — Encounter (HOSPITAL_COMMUNITY): Payer: Self-pay | Admitting: Emergency Medicine

## 2018-05-23 ENCOUNTER — Emergency Department (HOSPITAL_COMMUNITY)
Admission: EM | Admit: 2018-05-23 | Discharge: 2018-05-23 | Disposition: A | Discharge status: Home / Self Care | Payer: Commercial Managed Care - PPO | Attending: Emergency Medicine | Admitting: Emergency Medicine

## 2018-05-23 DIAGNOSIS — M5431 Sciatica, right side: Secondary | ICD-10-CM | POA: Diagnosis not present

## 2018-05-23 DIAGNOSIS — F17210 Nicotine dependence, cigarettes, uncomplicated: Secondary | ICD-10-CM | POA: Diagnosis not present

## 2018-05-23 DIAGNOSIS — M545 Low back pain: Secondary | ICD-10-CM | POA: Diagnosis present

## 2018-05-23 LAB — URINALYSIS, ROUTINE W REFLEX MICROSCOPIC
Bacteria, UA: NONE SEEN
Bilirubin Urine: NEGATIVE
GLUCOSE, UA: NEGATIVE mg/dL
KETONES UR: NEGATIVE mg/dL
LEUKOCYTES UA: NEGATIVE
NITRITE: NEGATIVE
PH: 5 (ref 5.0–8.0)
PROTEIN: NEGATIVE mg/dL
Specific Gravity, Urine: 1.016 (ref 1.005–1.030)

## 2018-05-23 LAB — POC URINE PREG, ED: PREG TEST UR: NEGATIVE

## 2018-05-23 MED ORDER — HYDROCODONE-ACETAMINOPHEN 5-325 MG PO TABS: ORAL_TABLET | ORAL | 0 refills | Status: DC

## 2018-05-23 MED ORDER — PREDNISONE 10 MG PO TABS: ORAL_TABLET | ORAL | 0 refills | Status: DC

## 2018-05-23 NOTE — ED Triage Notes (Signed)
Pt states her low back is hurting (chronic in nature) going down right leg.  States the last time this happened she was given Ibuprofen 800mg  and muscle relaxers but they did nothing.  Denies injury.

## 2018-05-23 NOTE — Discharge Instructions (Addendum)
Alternate ice and heat to your lower back.  Continue taking your muscle relaxers as directed.  Call your primary doctor to arrange a follow-up appointment in 1 week if not improving.  Return to the ER for any worsening symptoms.

## 2018-05-23 NOTE — ED Provider Notes (Signed)
[facility] EMERGENCY DEPARTMENT Provider Note   CSN: [id number] Arrival date & time: 05/23/18  1105     History   Chief Complaint Chief Complaint  Patient presents with  . Back Pain    HPI Amanda Morales is a 32 y.o. female.  HPI  Amanda Morales is a 32 y.o. female with hx of low back pain, presents to the Emergency Department complaining worsening back pain for several days.  Describes pain to midline lower back and radiates into right buttock and upper leg.  Pain worse with weight bearing.  Pain is recurrent, treated with ibuprofen and muscle relaxer previously without improvement. No hx of trauma.   Denies fever, chills, abdominal pain, urine or bowel changes, numbness of weakness of the lower extremities.    History reviewed. No pertinent past medical history.  There are no active problems to display for this patient.   Past Surgical History:  Procedure Laterality Date  . BACK SURGERY       OB History   None      Home Medications    Prior to Admission medications   Medication Sig Start Date End Date Taking? Authorizing Provider  ciprofloxacin (CIPRO) 500 MG tablet Take 1 tablet (500 mg total) by mouth 2 (two) times daily. 05/04/16   Courtany Mcmurphy, Rulon Eisenmenger B, FNP  HYDROcodone-acetaminophen (NORCO/VICODIN) 5-325 MG tablet Take one tab po q 4 hrs prn pain 05/23/18   Jonnell Hentges, PA-C  oxyCODONE-acetaminophen (ROXICET) 5-325 MG tablet Take 1 tablet by mouth every 6 (six) hours as needed. 05/04/16   Henslee Lottman, Rulon Eisenmenger B, FNP  predniSONE (DELTASONE) 10 MG tablet Take 6 tablets day one, 5 tablets day two, 4 tablets day three, 3 tablets day four, 2 tablets day five, then 1 tablet day six 05/23/18   Pauline Aus, PA-C    Family History History reviewed. No pertinent family history.  Social History Social History   Tobacco Use  . Smoking status: Current Every Day Smoker    Packs/day: 1.00    Years: 15.00    Pack years: 15.00    Types: Cigarettes  . Smokeless  tobacco: Never Used  Substance Use Topics  . Alcohol use: Yes    Comment: occ  . Drug use: No     Allergies   Penicillins   Review of Systems Review of Systems  Constitutional: Negative for fever.  Respiratory: Negative for chest tightness and shortness of breath.   Gastrointestinal: Negative for abdominal pain and vomiting.  Genitourinary: Negative for decreased urine volume, difficulty urinating, dysuria, flank pain and hematuria.  Musculoskeletal: Positive for back pain. Negative for joint swelling.  Skin: Negative for rash.  Neurological: Negative for weakness and numbness.  All other systems reviewed and are negative.    Physical Exam Updated Vital Signs BP 131/89 (BP Location: Right Arm)   Pulse 93   Temp 97.7 F (36.5 C) (Oral)   Resp 16   Ht 5\' 2"  (1.575 m)   Wt 90.7 kg (200 lb)   LMP 03/21/2018   SpO2 100%   BMI 36.58 kg/m   Physical Exam  Constitutional: She is oriented to person, place, and time. She appears well-developed and well-nourished. No distress.  HENT:  Head: Normocephalic and atraumatic.  Neck: Normal range of motion.  Cardiovascular: Normal rate, regular rhythm and intact distal pulses.  DP pulses are strong and palpable bilaterally  Pulmonary/Chest: Effort normal and breath sounds normal. No respiratory distress.  Abdominal: Soft. She exhibits no distension. There is no  tenderness.  Musculoskeletal: She exhibits tenderness. She exhibits no edema.       Lumbar back: She exhibits tenderness and pain. She exhibits normal range of motion, no swelling, no deformity, no laceration and normal pulse.  ttp of the lower lumbar spine and right lumbar paraspinal muscles.  Pt has 5/5 strength against resistance of bilateral lower extremities.     Neurological: She is alert and oriented to person, place, and time. She has normal strength. No sensory deficit. She exhibits normal muscle tone. Coordination and gait normal.  Reflex Scores:      Patellar  reflexes are 2+ on the right side and 2+ on the left side.      Achilles reflexes are 2+ on the right side and 2+ on the left side. Skin: Skin is warm and dry. Capillary refill takes less than 2 seconds. No rash noted.  Nursing note and vitals reviewed.    ED Treatments / Results  Labs (all labs ordered are listed, but only abnormal results are displayed) Labs Reviewed  URINALYSIS, ROUTINE W REFLEX MICROSCOPIC - Abnormal; Notable for the following components:      Result Value   APPearance HAZY (*)    Hgb urine dipstick MODERATE (*)    All other components within normal limits  POC URINE PREG, ED    EKG None  Radiology No results found.  Procedures Procedures (including critical care time)  Medications Ordered in ED Medications - No data to display   Initial Impression / Assessment and Plan / ED Course  I have reviewed the triage vital signs and the nursing notes.  Pertinent labs & imaging results that were available during my care of the patient were reviewed by me and considered in my medical decision making (see chart for details).     Controlled Substance Prescriptions [place] Controlled Substance Registry consulted? Yes, I have consulted the [place] Controlled Substances Registry for this patient, and feel the risk/benefit ratio today is favorable for proceeding with this prescription for a controlled substance.  Pt with likely sciatica.  Well appearing, vitals reviewed.  Likely acute on chronic pain.  No concerning sx's for acute neurological process. No trauma to indicate need for imaging.   Pt agrees to tx plan and return precautions discussed.     Final Clinical Impressions(s) / ED Diagnoses   Final diagnoses:  Sciatica of right side    ED Discharge Orders        Ordered    HYDROcodone-acetaminophen (NORCO/VICODIN) 5-325 MG tablet     05/23/18 1219    predniSONE (DELTASONE) 10 MG tablet     05/23/18 1219       Pauline Ausriplett, Emon Miggins, PA-C 05/24/18 2112      Jacalyn LefevreHaviland, Julie, MD 05/25/18 [phone]

## 2018-07-20 ENCOUNTER — Ambulatory Visit: Payer: Self-pay | Admitting: Orthopedic Surgery

## 2018-07-20 NOTE — H&P (Signed)
Amanda Morales is an 32 y.o. female.   Chief Complaint: back and leg pain HPI: Reason for Visit: Diagnositc Results (lumbar MRI)  Context: The patient is 7 1/2 weeks out from when symptoms began  Severity: pain level 6/10  Medications: The patient is currently taking Flexeril and Percocet. She is asking for a refill on the Percocet. Notes: [facility]. The patient reports the injection helped for one week  Past Medical Hx Depression  Past Surgical History:  Procedure Laterality Date  . BACK SURGERY     Social History:  reports that she has been smoking cigarettes. She has a 15.00 pack-year smoking history. She has never used smokeless tobacco. She reports that she drinks alcohol. She reports that she does not use drugs.  Tobacco Smoking Status: Current every day smoker Smoker (1 PPD) Tobacco-years of use: 15 Most Recent Tobacco Use Screening: 05/30/2018 Occupation: Other Chewing tobacco: none Alcohol intake: Occasional Hand Dominance: Right Work related injury?: N Advance directive: N Medical Power of Attorney: N  Allergies:  Allergies  Allergen Reactions  . Penicillins Hives and Rash   Medications: CLONAZEPAM 1 MG TABS CYCLOBENZAPRINE HYDROCHLORIDE 10 MG TABS FLUOXETINE HCL 10 MG CAPS PANTOPRAZOLE SODIUM 40 MG TBEC Percocet 5 mg-325 mg tablet  Review of Systems  Constitutional: Negative.   HENT: Negative.   Eyes: Negative.   Respiratory: Negative.   Cardiovascular: Negative.   Gastrointestinal: Negative.   Genitourinary: Negative.   Musculoskeletal: Positive for back pain.  Skin: Negative.   Neurological: Positive for sensory change and focal weakness.    There were no vitals taken for this visit. Physical Exam  Constitutional: She is oriented to person, place, and time. She appears well-developed and well-nourished.  HENT:  Head: Normocephalic.  Eyes: Pupils are equal, round, and reactive to light.  Neck: Normal range  of motion.  Cardiovascular: Normal rate.  Respiratory: Effort normal.  GI: Soft.  Musculoskeletal:  Patient is a 32 year old female.  Gait and Station: Appearance: ambulating with no assistive devices and antalgic gait.  Constitutional: General Appearance: healthy-appearing and distress (mild).  Psychiatric: Mood and Affect: active and alert.  Cardiovascular System: Edema Right: none; Dorsalis and posterior tibial pulses 2+. Edema Left: none.  Abdomen: Inspection and Palpation: non-distended and no tenderness.  Skin: Inspection and palpation: no rash.  Lumbar Spine: Inspection: normal alignment. Bony Palpation of the Lumbar Spine: tender at lumbosacral junction.. Bony Palpation of the Right Hip: no tenderness of the greater trochanter and tenderness of the SI joint; Pelvis stable. Bony Palpation of the Left Hip: no tenderness of the greater trochanter and tenderness of the SI joint. Soft Tissue Palpation on the Right: No flank pain with percussion. Active Range of Motion: limited flexion and extention.  Motor Strength: L1 Motor Strength on the Right: hip flexion iliopsoas 5/5. L1 Motor Strength on the Left: hip flexion iliopsoas 5/5. L2-L4 Motor Strength on the Right: knee extension quadriceps 5/5. L2-L4 Motor Strength on the Left: knee extension quadriceps 4/5. L5 Motor Strength on the Right: ankle dorsiflexion tibialis anterior 5/5 and great toe extension extensor hallucis longus 4/5. L5 Motor Strength on the Left: ankle dorsiflexion tibialis anterior 5/5 and great toe extension extensor hallucis longus 5/5. S1 Motor Strength on the Right: plantar flexion gastrocnemius 5/5. S1 Motor Strength on the Left: plantar flexion gastrocnemius 5/5.  Neurological System: Knee Reflex Right: normal (2). Knee Reflex Left: normal (2). Ankle Reflex Right: normal (2). Ankle Reflex Left: normal (2).  Babinski Reflex Right: plantar reflex absent. Babinski Reflex Left: plantar reflex absent. Sensation on the  Right: normal distal extremities. Sensation on the Left: normal distal extremities. Special Tests on the Right: no clonus of the ankle/knee and seated straight leg raising test positive. Special Tests on the Left: no clonus of the ankle/knee and seated straight leg raising test positive.  Decreased sensation L5 dermatome.   Neurological: She is alert and oriented to person, place, and time.  Skin: Skin is warm and dry.     Assessment/Plan HNP/stenosis  Patient temporary relief from an epidural steroid injection L3-4. She has continued neural tension signs. And radicular pain.  Discussed options. She is over 7 1/2 weeks status post her injury. She is in a fair amount of pain. She would like to proceed with surgical intervention.  We discussed living with her symptoms. I do not feel another injection would be beneficial.  We discussed lumbar decompression. I had an extensive discussion with the patient concerning the pathology relevant anatomy and treatment options. At this point exhausting conservative treatment and in the presence of a neurologic deficit we discussed microlumbar decompression. I discussed the risks and benefits including bleeding, infection, DVT, PE, anesthetic complications, worsening in their symptoms, improvement in their symptoms, C SF leakage, epidural fibrosis, need for future surgeries such as revision discectomy and lumbar fusion. I also indicated that this is an operation to basically decompress the nerve root to allow recovery as opposed to fixing a herniated disc and that the incidence of recurrent chest disc herniation can approach 15%. Also that nerve root recovery is variable and may not recover completely.  I discussed the operative course including overnight in the hospital. Immediate ambulation. Follow-up in 2 weeks for suture removal. 6 weeks until healing of the herniation followed by 6 weeks of reconditioning and strengthening of the core musculature. Also  discussed the need to employ the concepts of disc pressure management and core motion following the surgery to minimize the risk of recurrent disc herniation. We will obtain preoperative clearance i if necessary and proceed accordingly.  She most likely will require a laminectomy of L4-5 she had a history of a lumbar decompression L4-5. Due to her L5 symptoms we may require a decompression L5 nerve root. At the level of 4 5. This would re-a revision. 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 She is otherwise healthy. No history of MRSA.  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  Plan microlumbar decompression L3-4, revision L4-5  Shukri Nistler M., PA-C for Dr. Beane 07/20/2018, 1:03 PM   

## 2018-07-20 NOTE — H&P (View-Only) (Signed)
Amanda Morales is an 32 y.o. female.   Chief Complaint: back and leg pain HPI: Reason for Visit: Diagnositc Results (lumbar MRI)  Context: The patient is 7 1/2 weeks out from when symptoms began  Severity: pain level 6/10  Medications: The patient is currently taking Flexeril and Percocet. She is asking for a refill on the Percocet. Notes: The patient [facility]. The patient reports the injection helped for one week  Past Medical Hx Depression  Past Surgical History:  Procedure Laterality Date  . BACK SURGERY     Social History:  reports that she has been smoking cigarettes. She has a 15.00 pack-year smoking history. She has never used smokeless tobacco. She reports that she drinks alcohol. She reports that she does not use drugs.  Tobacco Smoking Status: Current every day smoker Smoker (1 PPD) Tobacco-years of use: 15 Most Recent Tobacco Use Screening: 05/30/2018 Occupation: Other Chewing tobacco: none Alcohol intake: Occasional Hand Dominance: Right Work related injury?: N Advance directive: N Medical Power of Attorney: N  Allergies:  Allergies  Allergen Reactions  . Penicillins Hives and Rash   Medications: CLONAZEPAM 1 MG TABS CYCLOBENZAPRINE HYDROCHLORIDE 10 MG TABS FLUOXETINE HCL 10 MG CAPS PANTOPRAZOLE SODIUM 40 MG TBEC Percocet 5 mg-325 mg tablet  Review of Systems  Constitutional: Negative.   HENT: Negative.   Eyes: Negative.   Respiratory: Negative.   Cardiovascular: Negative.   Gastrointestinal: Negative.   Genitourinary: Negative.   Musculoskeletal: Positive for back pain.  Skin: Negative.   Neurological: Positive for sensory change and focal weakness.    There were no vitals taken for this visit. Physical Exam  Constitutional: She is oriented to person, place, and time. She appears well-developed and well-nourished.  HENT:  Head: Normocephalic.  Eyes: Pupils are equal, round, and reactive to light.  Neck: Normal range  of motion.  Cardiovascular: Normal rate.  Respiratory: Effort normal.  GI: Soft.  Musculoskeletal:  Patient is a 32 year old female.  Gait and Station: Appearance: ambulating with no assistive devices and antalgic gait.  Constitutional: General Appearance: healthy-appearing and distress (mild).  Psychiatric: Mood and Affect: active and alert.  Cardiovascular System: Edema Right: none; Dorsalis and posterior tibial pulses 2+. Edema Left: none.  Abdomen: Inspection and Palpation: non-distended and no tenderness.  Skin: Inspection and palpation: no rash.  Lumbar Spine: Inspection: normal alignment. Bony Palpation of the Lumbar Spine: tender at lumbosacral junction.. Bony Palpation of the Right Hip: no tenderness of the greater trochanter and tenderness of the SI joint; Pelvis stable. Bony Palpation of the Left Hip: no tenderness of the greater trochanter and tenderness of the SI joint. Soft Tissue Palpation on the Right: No flank pain with percussion. Active Range of Motion: limited flexion and extention.  Motor Strength: L1 Motor Strength on the Right: hip flexion iliopsoas 5/5. L1 Motor Strength on the Left: hip flexion iliopsoas 5/5. L2-L4 Motor Strength on the Right: knee extension quadriceps 5/5. L2-L4 Motor Strength on the Left: knee extension quadriceps 4/5. L5 Motor Strength on the Right: ankle dorsiflexion tibialis anterior 5/5 and great toe extension extensor hallucis longus 4/5. L5 Motor Strength on the Left: ankle dorsiflexion tibialis anterior 5/5 and great toe extension extensor hallucis longus 5/5. S1 Motor Strength on the Right: plantar flexion gastrocnemius 5/5. S1 Motor Strength on the Left: plantar flexion gastrocnemius 5/5.  Neurological System: Knee Reflex Right: normal (2). Knee Reflex Left: normal (2). Ankle Reflex Right: normal (2). Ankle Reflex Left: normal (2).  Babinski Reflex Right: plantar reflex absent. Babinski Reflex Left: plantar reflex absent. Sensation on the  Right: normal distal extremities. Sensation on the Left: normal distal extremities. Special Tests on the Right: no clonus of the ankle/knee and seated straight leg raising test positive. Special Tests on the Left: no clonus of the ankle/knee and seated straight leg raising test positive.  Decreased sensation L5 dermatome.   Neurological: She is alert and oriented to person, place, and time.  Skin: Skin is warm and dry.     Assessment/Plan HNP/stenosis  Patient temporary relief from an epidural steroid injection L3-4. She has continued neural tension signs. And radicular pain.  Discussed options. She is over 7 1/2 weeks status post her injury. She is in a fair amount of pain. She would like to proceed with surgical intervention.  We discussed living with her symptoms. I do not feel another injection would be beneficial.  We discussed lumbar decompression. I had an extensive discussion with the patient concerning the pathology relevant anatomy and treatment options. At this point exhausting conservative treatment and in the presence of a neurologic deficit we discussed microlumbar decompression. I discussed the risks and benefits including bleeding, infection, DVT, PE, anesthetic complications, worsening in their symptoms, improvement in their symptoms, C SF leakage, epidural fibrosis, need for future surgeries such as revision discectomy and lumbar fusion. I also indicated that this is an operation to basically decompress the nerve root to allow recovery as opposed to fixing a herniated disc and that the incidence of recurrent chest disc herniation can approach 15%. Also that nerve root recovery is variable and may not recover completely.  I discussed the operative course including overnight in the hospital. Immediate ambulation. Follow-up in 2 weeks for suture removal. 6 weeks until healing of the herniation followed by 6 weeks of reconditioning and strengthening of the core musculature. Also  discussed the need to employ the concepts of disc pressure management and core motion following the surgery to minimize the risk of recurrent disc herniation. We will obtain preoperative clearance i if necessary and proceed accordingly.  She most likely will require a laminectomy of L4-5 she had a history of a lumbar decompression L4-5. Due to her L5 symptoms we may require a decompression L5 nerve root. At the level of 4 5. This would re-a revision. 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 She is otherwise healthy. No history of MRSA.  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  Plan microlumbar decompression L3-4, revision L4-5  Dorothy SparkBISSELL, Lillianne Eick M., PA-C for Dr. Shelle IronBeane 07/20/2018, 1:03 PM

## 2018-07-28 NOTE — Pre-Procedure Instructions (Signed)
Hoover BrownsJessica N Plath  07/28/2018     Your procedure is scheduled on August 04, 2018.  [facility].M.  Call this number if you have problems the morning of surgery:  [phone]   Remember:  Do not eat or drink after midnight.    Take these medicines the morning of surgery with A SIP OF WATER : Fluoxetine (Prozac) Clonazepam (Klonopin) if needed  NO SMOKING 24 hours prior to surgery  7 days prior to surgery STOP taking any Aspirin, Aleve, Naproxen, Ibuprofen, Motrin, Advil, Goody's, BC's, all herbal medications, fish oil, and all vitamins.    Do not wear jewelry, make-up or nail polish.  Do not wear lotions, powders, or perfumes, or deodorant.  Do not shave 48 hours prior to surgery.    Do not bring valuables to the hospital.  [facility] any belongings or valuables.  Contacts, dentures or bridgework may not be worn into surgery.  Leave your suitcase in the car.  After surgery it may be brought to your room.  For patients admitted to the hospital, discharge time will be determined by your treatment team.  Patients discharged the day of surgery will not be allowed to drive home.   Special instructions:   [place]- Preparing For Surgery  Before surgery, you can play an important role. Because skin is not sterile, your skin needs to be as free of germs as possible. You can reduce the number of germs on your skin by washing with CHG (chlorahexidine gluconate) Soap before surgery.  CHG is an antiseptic cleaner which kills germs and bonds with the skin to continue killing germs even after washing.    Oral Hygiene is also important to reduce your risk of infection.  Remember - BRUSH YOUR TEETH THE MORNING OF SURGERY WITH YOUR REGULAR TOOTHPASTE  Please do not use if you have an allergy to CHG or antibacterial soaps. If your skin becomes reddened/irritated stop using the CHG.  Do not shave (including legs and  underarms) for at least 48 hours prior to first CHG shower. It is OK to shave your face.  Please follow these instructions carefully.   1. Shower the NIGHT BEFORE SURGERY and the MORNING OF SURGERY with CHG.   2. If you chose to wash your hair, wash your hair first as usual with your normal shampoo.  3. After you shampoo, rinse your hair and body thoroughly to remove the shampoo.  4. Use CHG as you would any other liquid soap. You can apply CHG directly to the skin and wash gently with a scrungie or a clean washcloth.   5. Apply the CHG Soap to your body ONLY FROM THE NECK DOWN.  Do not use on open wounds or open sores. Avoid contact with your eyes, ears, mouth and genitals (private parts). Wash Face and genitals (private parts)  with your normal soap.  6. Wash thoroughly, paying special attention to the area where your surgery will be performed.  7. Thoroughly rinse your body with warm water from the neck down.  8. DO NOT shower/wash with your normal soap after using and rinsing off the CHG Soap.  9. Pat yourself dry with a CLEAN TOWEL.  10. Wear CLEAN PAJAMAS to bed the night before surgery, wear comfortable clothes the morning of surgery  11. Place CLEAN SHEETS on your bed the night of your first shower and DO NOT SLEEP WITH PETS.  Day of Surgery:  Do not apply any deodorants/lotions.  Please wear clean clothes to the hospital/surgery center.   Remember to brush your teeth WITH YOUR REGULAR TOOTHPASTE.    Please read over the following fact sheets that you were given.

## 2018-07-29 ENCOUNTER — Encounter (HOSPITAL_COMMUNITY): Payer: Self-pay

## 2018-07-29 ENCOUNTER — Ambulatory Visit (HOSPITAL_COMMUNITY)
Admission: RE | Admit: 2018-07-29 | Discharge: 2018-07-29 | Disposition: A | Discharge status: Home / Self Care | Payer: Commercial Managed Care - PPO | Source: Ambulatory Visit | Attending: Orthopedic Surgery | Admitting: Orthopedic Surgery

## 2018-07-29 ENCOUNTER — Encounter (HOSPITAL_COMMUNITY)
Admission: RE | Admit: 2018-07-29 | Discharge: 2018-07-29 | Disposition: A | Discharge status: Home / Self Care | Payer: Commercial Managed Care - PPO | Source: Ambulatory Visit | Attending: Specialist | Admitting: Specialist

## 2018-07-29 ENCOUNTER — Other Ambulatory Visit: Payer: Self-pay

## 2018-07-29 DIAGNOSIS — M5126 Other intervertebral disc displacement, lumbar region: Secondary | ICD-10-CM

## 2018-07-29 DIAGNOSIS — M47896 Other spondylosis, lumbar region: Secondary | ICD-10-CM | POA: Diagnosis not present

## 2018-07-29 DIAGNOSIS — Z01818 Encounter for other preprocedural examination: Secondary | ICD-10-CM | POA: Diagnosis not present

## 2018-07-29 HISTORY — DX: Unspecified asthma, uncomplicated: J45.909

## 2018-07-29 HISTORY — DX: Gastro-esophageal reflux disease without esophagitis: K21.9

## 2018-07-29 HISTORY — DX: Anxiety disorder, unspecified: F41.9

## 2018-07-29 HISTORY — DX: Major depressive disorder, single episode, unspecified: F32.9

## 2018-07-29 HISTORY — DX: Depression, unspecified: F32.A

## 2018-07-29 LAB — BASIC METABOLIC PANEL
Anion gap: 8 (ref 5–15)
BUN: 10 mg/dL (ref 6–20)
CO2: 24 mmol/L (ref 22–32)
CREATININE: 0.69 mg/dL (ref 0.44–1.00)
Calcium: 9.1 mg/dL (ref 8.9–10.3)
Chloride: 107 mmol/L (ref 98–111)
GFR calc Af Amer: >60 mL/min (ref >60)
GFR calc non Af Amer: >60 mL/min (ref >60)
Glucose, Bld: 95 mg/dL (ref 70–99)
Potassium: 4 mmol/L (ref 3.5–5.1)
SODIUM: 139 mmol/L (ref 135–145)

## 2018-07-29 LAB — CBC
HCT: 46.8 % — ABNORMAL HIGH (ref 36.0–46.0)
Hemoglobin: 15.5 g/dL — ABNORMAL HIGH (ref 12.0–15.0)
MCH: 32.3 pg (ref 26.0–34.0)
MCHC: 33.1 g/dL (ref 30.0–36.0)
MCV: 97.5 fL (ref 78.0–100.0)
Platelets: 235 10*3/uL (ref 150–400)
RBC: 4.8 MIL/uL (ref 3.87–5.11)
RDW: 12 % (ref 11.5–15.5)
WBC: 11 10*3/uL — ABNORMAL HIGH (ref 4.0–10.5)

## 2018-07-29 LAB — SURGICAL PCR SCREEN
MRSA, PCR: NEGATIVE
Staphylococcus aureus: NEGATIVE

## 2018-08-04 ENCOUNTER — Ambulatory Visit (HOSPITAL_COMMUNITY)
Admission: RE | Admit: 2018-08-04 | Discharge: 2018-08-05 | Disposition: A | Discharge status: Home / Self Care | Payer: Commercial Managed Care - PPO | Source: Ambulatory Visit | Attending: Specialist | Admitting: Specialist

## 2018-08-04 ENCOUNTER — Encounter (HOSPITAL_COMMUNITY): Payer: Self-pay | Admitting: General Practice

## 2018-08-04 ENCOUNTER — Ambulatory Visit (HOSPITAL_COMMUNITY): Payer: Commercial Managed Care - PPO

## 2018-08-04 ENCOUNTER — Encounter (HOSPITAL_COMMUNITY)
Admission: RE | Disposition: A | Discharge status: Home / Self Care | Payer: Self-pay | Source: Ambulatory Visit | Attending: Specialist

## 2018-08-04 ENCOUNTER — Other Ambulatory Visit: Payer: Self-pay

## 2018-08-04 DIAGNOSIS — F419 Anxiety disorder, unspecified: Secondary | ICD-10-CM | POA: Insufficient documentation

## 2018-08-04 DIAGNOSIS — Z880 Allergy status to penicillin: Secondary | ICD-10-CM | POA: Diagnosis not present

## 2018-08-04 DIAGNOSIS — F329 Major depressive disorder, single episode, unspecified: Secondary | ICD-10-CM | POA: Insufficient documentation

## 2018-08-04 DIAGNOSIS — M48061 Spinal stenosis, lumbar region without neurogenic claudication: Secondary | ICD-10-CM | POA: Diagnosis not present

## 2018-08-04 DIAGNOSIS — M5126 Other intervertebral disc displacement, lumbar region: Secondary | ICD-10-CM

## 2018-08-04 DIAGNOSIS — J45909 Unspecified asthma, uncomplicated: Secondary | ICD-10-CM | POA: Diagnosis not present

## 2018-08-04 DIAGNOSIS — M419 Scoliosis, unspecified: Secondary | ICD-10-CM | POA: Diagnosis not present

## 2018-08-04 DIAGNOSIS — Z79899 Other long term (current) drug therapy: Secondary | ICD-10-CM | POA: Diagnosis not present

## 2018-08-04 DIAGNOSIS — Z419 Encounter for procedure for purposes other than remedying health state, unspecified: Secondary | ICD-10-CM

## 2018-08-04 DIAGNOSIS — K219 Gastro-esophageal reflux disease without esophagitis: Secondary | ICD-10-CM | POA: Diagnosis not present

## 2018-08-04 HISTORY — PX: LUMBAR LAMINECTOMY/DECOMPRESSION MICRODISCECTOMY: SHX5026

## 2018-08-04 LAB — POCT PREGNANCY, URINE: Preg Test, Ur: NEGATIVE

## 2018-08-04 SURGERY — LUMBAR LAMINECTOMY/DECOMPRESSION MICRODISCECTOMY 2 LEVELS
Anesthesia: General

## 2018-08-04 MED ORDER — METHOCARBAMOL 1000 MG/10ML IJ SOLN
500.0000 mg | Freq: Four times a day (QID) | INTRAVENOUS | Status: DC | PRN
  Filled 2018-08-04: qty 5

## 2018-08-04 MED ORDER — ALUM & MAG HYDROXIDE-SIMETH 200-200-20 MG/5ML PO SUSP: 30.0000 mL | Freq: Four times a day (QID) | ORAL | Status: DC | PRN

## 2018-08-04 MED ORDER — ACETAMINOPHEN 325 MG PO TABS: 650.0000 mg | ORAL_TABLET | ORAL | Status: DC | PRN

## 2018-08-04 MED ORDER — HYDROMORPHONE HCL 1 MG/ML IJ SOLN
0.2500 mg | INTRAMUSCULAR | Status: DC | PRN
  Administered 2018-08-04 (×4): 0.5 mg via INTRAVENOUS

## 2018-08-04 MED ORDER — ACETAMINOPHEN 650 MG RE SUPP: 650.0000 mg | RECTAL | Status: DC | PRN

## 2018-08-04 MED ORDER — ONDANSETRON HCL 4 MG PO TABS: 4.0000 mg | ORAL_TABLET | Freq: Four times a day (QID) | ORAL | Status: DC | PRN

## 2018-08-04 MED ORDER — ROCURONIUM BROMIDE 50 MG/5ML IV SOSY
PREFILLED_SYRINGE | INTRAVENOUS | Status: AC
  Filled 2018-08-04: qty 5

## 2018-08-04 MED ORDER — VANCOMYCIN HCL IN DEXTROSE 1-5 GM/200ML-% IV SOLN
1000.0000 mg | Freq: Once | INTRAVENOUS | Status: AC
  Administered 2018-08-04: 1000 mg via INTRAVENOUS
  Filled 2018-08-04: qty 200

## 2018-08-04 MED ORDER — LIDOCAINE 2% (20 MG/ML) 5 ML SYRINGE
INTRAMUSCULAR | Status: AC
  Filled 2018-08-04: qty 5

## 2018-08-04 MED ORDER — HYDROMORPHONE HCL 1 MG/ML IJ SOLN
INTRAMUSCULAR | Status: AC
  Filled 2018-08-04: qty 1

## 2018-08-04 MED ORDER — PROMETHAZINE HCL 25 MG/ML IJ SOLN: 6.2500 mg | INTRAMUSCULAR | Status: DC | PRN

## 2018-08-04 MED ORDER — LIDOCAINE 2% (20 MG/ML) 5 ML SYRINGE
INTRAMUSCULAR | Status: DC | PRN
  Administered 2018-08-04: 60 mg via INTRAVENOUS

## 2018-08-04 MED ORDER — FENTANYL CITRATE (PF) 100 MCG/2ML IJ SOLN
INTRAMUSCULAR | Status: DC | PRN
  Administered 2018-08-04: 75 ug via INTRAVENOUS
  Administered 2018-08-04: 50 ug via INTRAVENOUS
  Administered 2018-08-04: 125 ug via INTRAVENOUS

## 2018-08-04 MED ORDER — POLYETHYLENE GLYCOL 3350 17 G PO PACK: 17.0000 g | PACK | Freq: Every day | ORAL | 0 refills | Status: DC

## 2018-08-04 MED ORDER — MEPERIDINE HCL 50 MG/ML IJ SOLN: 6.2500 mg | INTRAMUSCULAR | Status: DC | PRN

## 2018-08-04 MED ORDER — MIDAZOLAM HCL 2 MG/2ML IJ SOLN
INTRAMUSCULAR | Status: AC
  Filled 2018-08-04: qty 2

## 2018-08-04 MED ORDER — VANCOMYCIN HCL 1000 MG IV SOLR
INTRAVENOUS | Status: DC | PRN
  Administered 2018-08-04: 1000 mg via INTRAVENOUS

## 2018-08-04 MED ORDER — DOCUSATE SODIUM 100 MG PO CAPS
100.0000 mg | ORAL_CAPSULE | Freq: Two times a day (BID) | ORAL | Status: DC
  Administered 2018-08-04 (×2): 100 mg via ORAL
  Filled 2018-08-04 (×2): qty 1

## 2018-08-04 MED ORDER — ONDANSETRON HCL 4 MG/2ML IJ SOLN
INTRAMUSCULAR | Status: AC
  Filled 2018-08-04: qty 2

## 2018-08-04 MED ORDER — DOCUSATE SODIUM 100 MG PO CAPS: 100.0000 mg | ORAL_CAPSULE | Freq: Two times a day (BID) | ORAL | 1 refills | Status: DC

## 2018-08-04 MED ORDER — FLUOXETINE HCL 20 MG PO CAPS
20.0000 mg | ORAL_CAPSULE | Freq: Every day | ORAL | Status: DC
  Administered 2018-08-04: 20 mg via ORAL
  Filled 2018-08-04: qty 1

## 2018-08-04 MED ORDER — VANCOMYCIN HCL IN DEXTROSE 1-5 GM/200ML-% IV SOLN
1000.0000 mg | INTRAVENOUS | Status: AC
  Administered 2018-08-04: 1000 mg via INTRAVENOUS
  Filled 2018-08-04: qty 200

## 2018-08-04 MED ORDER — ROCURONIUM BROMIDE 50 MG/5ML IV SOSY
PREFILLED_SYRINGE | INTRAVENOUS | Status: DC | PRN
  Administered 2018-08-04: 20 mg via INTRAVENOUS
  Administered 2018-08-04: 30 mg via INTRAVENOUS
  Administered 2018-08-04: 50 mg via INTRAVENOUS
  Administered 2018-08-04 (×2): 20 mg via INTRAVENOUS

## 2018-08-04 MED ORDER — DIPHENHYDRAMINE HCL 50 MG/ML IJ SOLN
INTRAMUSCULAR | Status: DC | PRN
  Administered 2018-08-04: 12.5 mg via INTRAVENOUS

## 2018-08-04 MED ORDER — ACETAMINOPHEN 10 MG/ML IV SOLN
1000.0000 mg | INTRAVENOUS | Status: AC
Start: 2018-08-04 — End: 2018-08-04
  Administered 2018-08-04: 1000 mg via INTRAVENOUS
  Filled 2018-08-04: qty 100

## 2018-08-04 MED ORDER — ONDANSETRON HCL 4 MG/2ML IJ SOLN
INTRAMUSCULAR | Status: DC | PRN
  Administered 2018-08-04: 4 mg via INTRAVENOUS

## 2018-08-04 MED ORDER — ARTIFICIAL TEARS OPHTHALMIC OINT
TOPICAL_OINTMENT | OPHTHALMIC | Status: AC
  Filled 2018-08-04: qty 3.5

## 2018-08-04 MED ORDER — CLONAZEPAM 1 MG PO TABS
1.0000 mg | ORAL_TABLET | Freq: Two times a day (BID) | ORAL | Status: DC | PRN
  Administered 2018-08-04: 1 mg via ORAL
  Filled 2018-08-04: qty 1

## 2018-08-04 MED ORDER — OXYCODONE HCL 5 MG PO TABS
10.0000 mg | ORAL_TABLET | ORAL | Status: DC | PRN
  Administered 2018-08-04 – 2018-08-05 (×5): 10 mg via ORAL
  Filled 2018-08-04 (×5): qty 2

## 2018-08-04 MED ORDER — LACTATED RINGERS IV SOLN: INTRAVENOUS | Status: DC

## 2018-08-04 MED ORDER — MENTHOL 3 MG MT LOZG: 1.0000 | LOZENGE | OROMUCOSAL | Status: DC | PRN

## 2018-08-04 MED ORDER — THROMBIN (RECOMBINANT) 20000 UNITS EX SOLR
CUTANEOUS | Status: AC
  Filled 2018-08-04: qty 20000

## 2018-08-04 MED ORDER — DEXAMETHASONE SODIUM PHOSPHATE 10 MG/ML IJ SOLN
INTRAMUSCULAR | Status: AC
  Filled 2018-08-04: qty 1

## 2018-08-04 MED ORDER — SODIUM CHLORIDE 0.9 % IV SOLN
INTRAVENOUS | Status: DC | PRN
  Administered 2018-08-04: 07:00:00

## 2018-08-04 MED ORDER — OXYCODONE HCL 5 MG PO TABS: 5.0000 mg | ORAL_TABLET | ORAL | Status: DC | PRN

## 2018-08-04 MED ORDER — BISACODYL 5 MG PO TBEC: 5.0000 mg | DELAYED_RELEASE_TABLET | Freq: Every day | ORAL | Status: DC | PRN

## 2018-08-04 MED ORDER — MIDAZOLAM HCL 5 MG/5ML IJ SOLN
INTRAMUSCULAR | Status: DC | PRN
  Administered 2018-08-04: 2 mg via INTRAVENOUS

## 2018-08-04 MED ORDER — HYDROMORPHONE HCL 1 MG/ML IJ SOLN
1.0000 mg | INTRAMUSCULAR | Status: DC | PRN
  Administered 2018-08-04: 1 mg via INTRAVENOUS
  Filled 2018-08-04: qty 1

## 2018-08-04 MED ORDER — PHENOL 1.4 % MT LIQD: 1.0000 | OROMUCOSAL | Status: DC | PRN

## 2018-08-04 MED ORDER — 0.9 % SODIUM CHLORIDE (POUR BTL) OPTIME
TOPICAL | Status: DC | PRN
  Administered 2018-08-04: 1000 mL

## 2018-08-04 MED ORDER — ALBUTEROL SULFATE HFA 108 (90 BASE) MCG/ACT IN AERS
INHALATION_SPRAY | RESPIRATORY_TRACT | Status: DC | PRN
  Administered 2018-08-04: 6 via RESPIRATORY_TRACT

## 2018-08-04 MED ORDER — CHLORHEXIDINE GLUCONATE 4 % EX LIQD: 60.0000 mL | Freq: Once | CUTANEOUS | Status: DC

## 2018-08-04 MED ORDER — LACTATED RINGERS IV SOLN
INTRAVENOUS | Status: DC
  Administered 2018-08-04: 07:00:00 via INTRAVENOUS

## 2018-08-04 MED ORDER — FENTANYL CITRATE (PF) 250 MCG/5ML IJ SOLN
INTRAMUSCULAR | Status: AC
  Filled 2018-08-04: qty 5

## 2018-08-04 MED ORDER — RISAQUAD PO CAPS
1.0000 | ORAL_CAPSULE | Freq: Every day | ORAL | Status: DC
  Filled 2018-08-04 (×2): qty 1

## 2018-08-04 MED ORDER — ARTIFICIAL TEARS OPHTHALMIC OINT
TOPICAL_OINTMENT | OPHTHALMIC | Status: DC | PRN
  Administered 2018-08-04: 1 via OPHTHALMIC

## 2018-08-04 MED ORDER — METHOCARBAMOL 500 MG PO TABS
500.0000 mg | ORAL_TABLET | Freq: Four times a day (QID) | ORAL | Status: DC | PRN
  Administered 2018-08-04 – 2018-08-05 (×3): 500 mg via ORAL
  Filled 2018-08-04 (×3): qty 1

## 2018-08-04 MED ORDER — SUGAMMADEX SODIUM 200 MG/2ML IV SOLN
INTRAVENOUS | Status: DC | PRN
  Administered 2018-08-04: 187.2 mg via INTRAVENOUS

## 2018-08-04 MED ORDER — METHOCARBAMOL 500 MG PO TABS: 500.0000 mg | ORAL_TABLET | Freq: Four times a day (QID) | ORAL | 1 refills | Status: DC | PRN

## 2018-08-04 MED ORDER — ONDANSETRON HCL 4 MG/2ML IJ SOLN: 4.0000 mg | Freq: Four times a day (QID) | INTRAMUSCULAR | Status: DC | PRN

## 2018-08-04 MED ORDER — PROPOFOL 10 MG/ML IV BOLUS
INTRAVENOUS | Status: DC | PRN
  Administered 2018-08-04: 150 mg via INTRAVENOUS

## 2018-08-04 MED ORDER — ALBUTEROL SULFATE HFA 108 (90 BASE) MCG/ACT IN AERS
INHALATION_SPRAY | RESPIRATORY_TRACT | Status: AC
  Filled 2018-08-04: qty 6.7

## 2018-08-04 MED ORDER — DEXAMETHASONE SODIUM PHOSPHATE 10 MG/ML IJ SOLN
INTRAMUSCULAR | Status: DC | PRN
  Administered 2018-08-04: 10 mg via INTRAVENOUS

## 2018-08-04 MED ORDER — DIPHENHYDRAMINE HCL 50 MG/ML IJ SOLN
INTRAMUSCULAR | Status: AC
  Filled 2018-08-04: qty 1

## 2018-08-04 MED ORDER — BUPIVACAINE-EPINEPHRINE 0.5% -1:200000 IJ SOLN
INTRAMUSCULAR | Status: DC | PRN
  Administered 2018-08-04: 15 mL

## 2018-08-04 MED ORDER — THROMBIN 20000 UNITS EX SOLR
CUTANEOUS | Status: DC | PRN
  Administered 2018-08-04: 08:00:00 via TOPICAL

## 2018-08-04 MED ORDER — MAGNESIUM CITRATE PO SOLN: 1.0000 | Freq: Once | ORAL | Status: DC | PRN

## 2018-08-04 MED ORDER — PROPOFOL 10 MG/ML IV BOLUS
INTRAVENOUS | Status: AC
  Filled 2018-08-04: qty 20

## 2018-08-04 MED ORDER — BUPIVACAINE-EPINEPHRINE (PF) 0.5% -1:200000 IJ SOLN
INTRAMUSCULAR | Status: AC
  Filled 2018-08-04: qty 30

## 2018-08-04 MED ORDER — KCL IN DEXTROSE-NACL 20-5-0.45 MEQ/L-%-% IV SOLN
INTRAVENOUS | Status: DC
  Administered 2018-08-04: 15:00:00 via INTRAVENOUS

## 2018-08-04 MED ORDER — OXYCODONE HCL 5 MG PO TABS: 5.0000 mg | ORAL_TABLET | ORAL | 0 refills | Status: DC | PRN

## 2018-08-04 MED ORDER — POLYETHYLENE GLYCOL 3350 17 G PO PACK: 17.0000 g | PACK | Freq: Every day | ORAL | Status: DC

## 2018-08-04 SURGICAL SUPPLY — 55 items
BAG DECANTER FOR FLEXI CONT (MISCELLANEOUS) ×3 IMPLANT
CLOSURE WOUND 1/2 X4 (GAUZE/BANDAGES/DRESSINGS) ×1
CLOTH 2% CHLOROHEXIDINE 3PK (PERSONAL CARE ITEMS) ×3 IMPLANT
CONT SPEC 4OZ CLIKSEAL STRL BL (MISCELLANEOUS) ×3 IMPLANT
DRAPE LAPAROTOMY 100X72X124 (DRAPES) ×3 IMPLANT
DRAPE MICROSCOPE LEICA (MISCELLANEOUS) ×3 IMPLANT
DRAPE SHEET LG 3/4 BI-LAMINATE (DRAPES) ×3 IMPLANT
DRAPE SURG 17X11 SM STRL (DRAPES) ×3 IMPLANT
DRAPE UTILITY XL STRL (DRAPES) ×3 IMPLANT
DRSG AQUACEL AG ADV 3.5X 4 (GAUZE/BANDAGES/DRESSINGS) IMPLANT
DRSG AQUACEL AG ADV 3.5X 6 (GAUZE/BANDAGES/DRESSINGS) ×3 IMPLANT
DRSG TELFA 3X8 NADH (GAUZE/BANDAGES/DRESSINGS) IMPLANT
DURAPREP 26ML APPLICATOR (WOUND CARE) ×3 IMPLANT
DURASEAL SPINE SEALANT 3ML (MISCELLANEOUS) IMPLANT
ELECT BLADE 4.0 EZ CLEAN MEGAD (MISCELLANEOUS)
ELECT REM PT RETURN 9FT ADLT (ELECTROSURGICAL) ×3
ELECTRODE BLDE 4.0 EZ CLN MEGD (MISCELLANEOUS) IMPLANT
ELECTRODE REM PT RTRN 9FT ADLT (ELECTROSURGICAL) ×1 IMPLANT
GLOVE BIOGEL PI IND STRL 7.0 (GLOVE) ×2 IMPLANT
GLOVE BIOGEL PI IND STRL 7.5 (GLOVE) ×3 IMPLANT
GLOVE BIOGEL PI INDICATOR 7.0 (GLOVE) ×4
GLOVE BIOGEL PI INDICATOR 7.5 (GLOVE) ×6
GLOVE SURG SS PI 7.5 STRL IVOR (GLOVE) ×6 IMPLANT
GLOVE SURG SS PI 8.0 STRL IVOR (GLOVE) ×6 IMPLANT
GOWN STRL REUS W/ TWL LRG LVL3 (GOWN DISPOSABLE) ×2 IMPLANT
GOWN STRL REUS W/ TWL XL LVL3 (GOWN DISPOSABLE) ×1 IMPLANT
GOWN STRL REUS W/TWL LRG LVL3 (GOWN DISPOSABLE) ×4
GOWN STRL REUS W/TWL XL LVL3 (GOWN DISPOSABLE) ×2
IV CATH 14GX2 1/4 (CATHETERS) ×3 IMPLANT
KIT BASIN OR (CUSTOM PROCEDURE TRAY) ×3 IMPLANT
KIT POSITION SURG JACKSON T1 (MISCELLANEOUS) ×3 IMPLANT
NEEDLE 22X1 1/2 (OR ONLY) (NEEDLE) ×3 IMPLANT
NEEDLE SPNL 18GX3.5 QUINCKE PK (NEEDLE) ×6 IMPLANT
PACK LAMINECTOMY NEURO (CUSTOM PROCEDURE TRAY) ×3 IMPLANT
PATTIES SURGICAL .5 X.5 (GAUZE/BANDAGES/DRESSINGS) ×3 IMPLANT
PATTIES SURGICAL .75X.75 (GAUZE/BANDAGES/DRESSINGS) ×3 IMPLANT
RUBBERBAND STERILE (MISCELLANEOUS) ×6 IMPLANT
SPONGE LAP 4X18 RFD (DISPOSABLE) IMPLANT
SPONGE SURGIFOAM ABS GEL 100 (HEMOSTASIS) ×3 IMPLANT
STAPLER VISISTAT (STAPLE) ×3 IMPLANT
STRIP CLOSURE SKIN 1/2X4 (GAUZE/BANDAGES/DRESSINGS) ×2 IMPLANT
SUT NURALON 4 0 TR CR/8 (SUTURE) IMPLANT
SUT PROLENE 3 0 PS 2 (SUTURE) IMPLANT
SUT VIC AB 1 CT1 27 (SUTURE) ×6
SUT VIC AB 1 CT1 27XBRD ANBCTR (SUTURE) ×3 IMPLANT
SUT VIC AB 1 CT1 27XBRD ANTBC (SUTURE) IMPLANT
SUT VIC AB 1-0 CT2 27 (SUTURE) ×6 IMPLANT
SUT VIC AB 2-0 CT1 27 (SUTURE)
SUT VIC AB 2-0 CT1 TAPERPNT 27 (SUTURE) IMPLANT
SUT VIC AB 2-0 CT2 27 (SUTURE) IMPLANT
SYR 3ML LL SCALE MARK (SYRINGE) ×3 IMPLANT
TOWEL GREEN STERILE (TOWEL DISPOSABLE) ×3 IMPLANT
TOWEL GREEN STERILE FF (TOWEL DISPOSABLE) ×3 IMPLANT
TRAY FOLEY MTR SLVR 16FR STAT (SET/KITS/TRAYS/PACK) ×3 IMPLANT
YANKAUER SUCT BULB TIP NO VENT (SUCTIONS) ×3 IMPLANT

## 2018-08-04 NOTE — Plan of Care (Signed)
  Problem: Safety: Goal: Ability to remain free from injury will improve Outcome: Progressing   

## 2018-08-04 NOTE — Anesthesia Postprocedure Evaluation (Signed)
Anesthesia Post Note  Patient: Amanda Morales  Procedure(s) Performed: Microlumbar decompression Lumbar three-four, Revision Lumbar four-five (N/A )     Patient location during evaluation: PACU Anesthesia Type: General Level of consciousness: awake and alert Pain management: pain level controlled Vital Signs Assessment: post-procedure vital signs reviewed and stable Respiratory status: spontaneous breathing, nonlabored ventilation, respiratory function stable and patient connected to nasal cannula oxygen Cardiovascular status: blood pressure returned to baseline and stable Postop Assessment: no apparent nausea or vomiting Anesthetic complications: no    Last Vitals:  Vitals:   08/04/18 1335 08/04/18 1601  BP: 119/75 118/85  Pulse: 83 76  Resp: 16 18  Temp: 37.1 C 36.4 C  SpO2: 96% 96%    Last Pain:  Vitals:   08/04/18 1601  TempSrc: Oral  PainSc:                  Shelton SilvasKevin D Julicia Krieger

## 2018-08-04 NOTE — Evaluation (Signed)
Physical Therapy Evaluation and Discharge Patient Details Name: Amanda BrownsJessica N Morales MRN: [id number] DOB: 05/18/1986 Today's Date: 08/04/2018   History of Present Illness  Amanda MemosJessica Mestre is a 32yo F who presents with herniated nucleus pulposis and stenosis and is now s/p Microlumbar decompression Lumbar three-four, Revision Lumbar four-five. PMH includes back surgery and tobacco abuse.   Clinical Impression  Patient evaluated by Physical Therapy with no further acute PT needs identified. All education has been completed and the patient has no further questions. Pt guarded during gait and stair navigation, however, overall steady. Required supervision for mobility tasks. Educated about walking program, car transfers, and back precautions. Pt reports she will have assist from her husband at d/c. See below for any follow-up Physical Therapy or equipment needs. PT is signing off. Thank you for this referral. If needs change, please re-consult.      Follow Up Recommendations No PT follow up    Equipment Recommendations  None recommended by PT    Recommendations for Other Services       Precautions / Restrictions Precautions Precautions: Back Precaution Booklet Issued: No Precaution Comments: Pt recalled 3/3 precautions without cues.  Restrictions Weight Bearing Restrictions: No      Mobility  Bed Mobility Overal bed mobility: Needs Assistance Bed Mobility: Rolling;Sidelying to Sit Rolling: Supervision Sidelying to sit: Supervision       General bed mobility comments: vc for log rolling  Transfers Overall transfer level: Needs assistance Equipment used: None Transfers: Sit to/from Stand Sit to Stand: Supervision         General transfer comment: Supervision for safety.   Ambulation/Gait Ambulation/Gait assistance: Supervision Gait Distance (Feet): 200 Feet Assistive device: None;IV Pole Gait Pattern/deviations: Step-through pattern Gait velocity: Decreased    General  Gait Details: Slow, very guarded gait, however, overall steady. Using IV pole for comfort, however, was able to ambulate without with supervision for safety. Educated about generalized walking program to perform at home.   Stairs Stairs: Yes Stairs assistance: Supervision Stair Management: One rail Left;Alternating pattern;Forwards Number of Stairs: 3 General stair comments: Overall safe stair navigation using rail. No LOB noted.   Wheelchair Mobility    Modified Rankin (Stroke Patients Only)       Balance Overall balance assessment: Mild deficits observed, not formally tested                                           Pertinent Vitals/Pain Pain Assessment: Faces Faces Pain Scale: Hurts a little bit Pain Location: L spine, sx site Pain Descriptors / Indicators: Aching Pain Intervention(s): Limited activity within patient's tolerance;Monitored during session;Repositioned    Home Living Family/patient expects to be discharged to:: Private residence Living Arrangements: Spouse/significant other Available Help at Discharge: Family Type of Home: House Home Access: Stairs to enter Entrance Stairs-Rails: Right;Left;Can reach both Secretary/administratorntrance Stairs-Number of Steps: 6 Home Layout: One level Home Equipment: None      Prior Function Level of Independence: Independent         Comments: stopped working d/t back injury     Hand Dominance   Dominant Hand: Right    Extremity/Trunk Assessment   Upper Extremity Assessment Upper Extremity Assessment: Defer to OT evaluation    Lower Extremity Assessment Lower Extremity Assessment: Overall WFL for tasks assessed    Cervical / Trunk Assessment Cervical / Trunk Assessment: Other exceptions Cervical / Trunk Exceptions: s/p  lumbar surgery   Communication   Communication: No difficulties  Cognition Arousal/Alertness: Awake/alert Behavior During Therapy: WFL for tasks assessed/performed Overall Cognitive  Status: Within Functional Limits for tasks assessed                                        General Comments      Exercises     Assessment/Plan    PT Assessment Patent does not need any further PT services  PT Problem List         PT Treatment Interventions      PT Goals (Current goals can be found in the Care Plan section)  Acute Rehab PT Goals Patient Stated Goal: "to get back home tomorrow"  PT Goal Formulation: With patient Time For Goal Achievement: 08/04/18 Potential to Achieve Goals: Good    Frequency     Barriers to discharge        Co-evaluation               AM-PAC PT "6 Clicks" Daily Activity  Outcome Measure Difficulty turning over in bed (including adjusting bedclothes, sheets and blankets)?: A Little Difficulty moving from lying on back to sitting on the side of the bed? : A Little Difficulty sitting down on and standing up from a chair with arms (e.g., wheelchair, bedside commode, etc,.)?: A Little Help needed moving to and from a bed to chair (including a wheelchair)?: None Help needed walking in hospital room?: None Help needed climbing 3-5 steps with a railing? : A Little 6 Click Score: 20    End of Session Equipment Utilized During Treatment: Gait belt Activity Tolerance: Patient tolerated treatment well Patient left: in bed;with call bell/phone within reach(sitting EOB ) Nurse Communication: Mobility status PT Visit Diagnosis: Other abnormalities of gait and mobility (R26.89);Pain Pain - part of body: (back)    Time: 0981-1914 PT Time Calculation (min) (ACUTE ONLY): 12 min   Charges:   PT Evaluation $PT Eval Low Complexity: 1 Low          Gladys Damme, PT, DPT  Acute Rehabilitation Services  Pager: [phone]   Lehman Prom 08/04/2018, 6:23 PM

## 2018-08-04 NOTE — Anesthesia Procedure Notes (Addendum)
Procedure Name: Intubation Date/Time: 08/04/2018 7:45 AM Performed by: Scheryl Darter, CRNA Pre-anesthesia Checklist: Patient identified, Emergency Drugs available, Suction available and Patient being monitored Patient Re-evaluated:Patient Re-evaluated prior to induction Oxygen Delivery Method: Circle System Utilized Preoxygenation: Pre-oxygenation with 100% oxygen Induction Type: IV induction Ventilation: Mask ventilation without difficulty Laryngoscope Size: Mac and 3 Grade View: Grade I Tube type: Oral Tube size: 7.0 mm Number of attempts: 1 Airway Equipment and Method: Stylet and Oral airway Placement Confirmation: ETT inserted through vocal cords under direct vision,  positive ETCO2 and breath sounds checked- equal and bilateral Secured at: 22 cm Tube secured with: Tape Dental Injury: Teeth and Oropharynx as per pre-operative assessment

## 2018-08-04 NOTE — Op Note (Signed)
NAME: Amanda Morales, Amanda Morales MEDICAL RECORD ZO:[id number] ACCOUNT [id number] DATE OF BIRTH:08-12-1986 FACILITY: [facility] LOCATION: [facility] PHYSICIAN:Melanny Wire Connye Burkitt, MD  OPERATIVE REPORT  DATE OF PROCEDURE:  08/04/2018  PREOPERATIVE DIAGNOSES:   1.  Spinal stenosis, recurrent L4-L5. 2.  Spinal stenosis and herniated nucleus pulposus, L3-L4.  POSTOPERATIVE DIAGNOSES:   1.  Spinal stenosis, recurrent L4-L5. 2.  Spinal stenosis and herniated nucleus pulposus, L3-L4.  PROCEDURE PERFORMED:   1.  Revision lumbar decompression, L4-L5. 2.  Central decompression at L3-L4 with bilateral L3-L4 foraminotomies. 3.  Lateral recess decompression and central laminectomy of L4.  ANESTHESIA:  General.  ASSISTANT:  Andrez Grime, PA.  HISTORY:  This is a pleasant 32 year old female who has had a history of lumbar decompression at L4-L5 and L5-S1 in the past.  She had a disk herniation at L3-L4 and stenosis at L4-L5.  She fracture risk activity modification and physical therapy.  She  had weakness in her tibialis anterior as well as EHL and quadriceps on the right preoperatively as well as associated numbness in L4-L5 dermatome.  She had less symptomatology on the contralateral side.  She had a disk herniation noted at L3-L4 large  central and to the right.  Disk protrusions in the left lateral recess stenosis at L4-L5.  She had underlying scoliosis as well.  We discussed decompression at L3-L4 for purposes of performing the diskectomy.  Then removal of the neural arch of four most  probable to perform the revision decompression of the stenosis was noted at the top of the neural arch at four in the lateral recesses.  Risks and benefits discussed including bleeding, infection, damage to neurovascular structures, no change in  symptoms, worsening symptoms, DVT, PE, anesthetic complication, need for fusion in the future, residual back pain.  She had multilevel disk degeneration with felt the possibility of a fusion  in the future.  She did have underlying scoliosis as well.  DESCRIPTION OF PROCEDURE:  With the patient in supine position and after induction of adequate general anesthesia and a gram of vancomycin she was placed prone on the [facility] table with iliac crest padded.  Abdomen free.  Legs flexed on a sling.  Foley  to gravity.  Arms padded in the appropriate position.  Lumbar region was prepped and draped in the usual sterile fashion.  Two 18-gauge spinal needles utilized to localize the 3-4, 4-5 interspace confirmed with x-ray.  Incision was made from the spinous  process of 3-5.  Subcutaneous tissue was dissected with electrocautery to achieve hemostasis.  She had ample subcutaneous adipose tissue.  The dorsal lumbar fascia was noted.  We injected 0.25% Marcaine with epinephrine in the paraspinous musculature for hemostasis and anesthesia.  Paraspinous muscle elevated from the lamina of 3-4 and 4-5.  McCullough retractor was placed.  We obtained a  radiograph.  Initial spinous process was 2 and 3.  We translated the [facility] distally and re-x-rayed.  There was a very small interlaminar window at 3-4 and at 4-5.  There was some shingling noted at 4.  Some convexity to the left.  I felt it was  appropriate to therefore remove the spinous process of 4 and partial of 3.  We skeletonized the previous laminotomies at L4-5 bilaterally.  We performed hemilaminotomies of the caudad edge of 3 bilaterally to the point to detach the ligamentum flavum,  but preserving the pars.  A micro curette was utilized to detach ligamentum flavum from the cephalad edge of 4.  There were hypertrophic facets  noted bilaterally.  ____ placed beneath the ligamentum flavum.  We performed hemilaminotomies of 4  bilaterally.  There was hypertrophic and thickened on the right side.  There was scoliosis and on the left less so.  With the ligamentum flavum removed from the interspace, we protected the neural elements.  I then performed  foraminotomies of L4  bilaterally again protecting the neural elements.  The 4 root was noted to be severely compressed into the lateral recess.  Unable to mobilize the root initially.  I proceeded with, therefore, the full hemilaminectomy of 4 to gain further mobilization  and decompressed 4-5.  After detaching epidural fibrosis from the inferior edge of 4 and placing patties beneath that, I used a  combination of 2 mm Kerrison and Woodson retractor protecting neural elements and removed with the neural arch.  This allowed  us to gain access to extended foraminotomy of 4.  I was able to get below and above the disk herniation.  A confirmatory radiograph at L3-L4 was obtained.  We used bone wax on cancellous surfaces and bipolar cautery and thrombin-soaked Gelfoam for  hemostasis.  This addressed stenosis at the top of the neural arch of 4, preserving the pars there as well.  We were able to then gain the lateral aspect of the thecal sac at 3 above and below the root compressed in the lateral recess, gently mobilizing  it medially.  Again, we had to decompress then the lateral recess to the medial border of the pedicle.  A focal large HNP was noted.  The 4 root was still tethered somewhat.  I made a small incision and removed the disk herniation.  There were 3 large  fragments removed.  This was mobilized gently prior to that irrigation.  This was mobilized gently with a micro nerve hook and a micropituitary removing the fragments.  This decompressed the 4 root and allowed better mobilization further.  Following  this, I retracted the 4 root down to the lateral recess and foramen.  It was noted to be significantly compressed and attenuated, but intact.   Above that, I had gone above the shoulder of the root and the 3 area to gain access to the lateral aspect of  the thecal sac and performed a 3 foraminotomy.  We decompressed on the left side as well lateral recess to the medial border of the pedicle and  performed a foraminotomy of 4 as there was some stenosis noted here as well.  It also allowed for  mobilization.  There is no disk herniation on the left noted.  Bipolar cautery was utilized to achieve hemostasis.  I copiously irrigated the disk space and laminotomy with antibiotic irrigation.  Neuro probe passed freely out the foramen of 4, 3 and  caudad through 5.  There was a significant amount of epidural fibrosis just distal to the area of the laminectomy of 4.  I felt that it was best left without developing that plane to avoid a durotomy as I felt that the main compression was already  addressed.  After copious irrigation and no evidence of CSF leakage or active bleeding, again we bone waxed all cancellous surfaces.  Three patties of thrombin-soaked Gelfoam were placed and then removed with one small one remaining distally.  I removed  the McCullough retractor.  We relaxed the incision throughout the case periodically.  I copiously irrigated with antibiotic irrigation.  No active bleeding.  I closed the dorsal fascia #1 Vicryl interrupted figure-of-eight sutures.  Aperture distally was  left open for evacuation for a hematoma if this develops.  Subcutaneous with 2-0 and skin with staples.  Wound was dressed sterilely, placed supine on the hospital bed, extubated without difficulty and transported to the recovery room in satisfactory  condition.  The patient tolerated the procedure well.  There were no complications.  Blood loss was 250 mL.  Just prior to that, there was some epidural bleeding noted.  This was hemostased.  TN/NUANCE  D:08/04/2018 T:08/04/2018 JOB:[id number]

## 2018-08-04 NOTE — Anesthesia Preprocedure Evaluation (Addendum)
Anesthesia Evaluation  Patient identified by MRN, date of birth, ID band Patient awake    Reviewed: Allergy & Precautions, NPO status , Patient's Chart, lab work & pertinent test results  Airway Mallampati: II  TM Distance: >3 FB Neck ROM: Full    Dental  (+) Teeth Intact, Dental Advisory Given   Pulmonary asthma , Current Smoker,    breath sounds clear to auscultation       Cardiovascular negative cardio ROS   Rhythm:Regular Rate:Normal     Neuro/Psych PSYCHIATRIC DISORDERS Anxiety Depression    GI/Hepatic Neg liver ROS, GERD  ,  Endo/Other  negative endocrine ROS  Renal/GU negative Renal ROS     Musculoskeletal negative musculoskeletal ROS (+)   Abdominal Normal abdominal exam  (+)   Peds  Hematology negative hematology ROS (+)   Anesthesia Other Findings   Reproductive/Obstetrics                            Anesthesia Physical Anesthesia Plan  ASA: II  Anesthesia Plan: General   Post-op Pain Management:    Induction: Intravenous  PONV Risk Score and Plan: 3 and Ondansetron, Dexamethasone and Midazolam  Airway Management Planned: Oral ETT  Additional Equipment: None  Intra-op Plan:   Post-operative Plan: Extubation in OR  Informed Consent: I have reviewed the patients History and Physical, chart, labs and discussed the procedure including the risks, benefits and alternatives for the proposed anesthesia with the patient or authorized representative who has indicated his/her understanding and acceptance.   Dental advisory given  Plan Discussed with: CRNA  Anesthesia Plan Comments:        Anesthesia Quick Evaluation

## 2018-08-04 NOTE — Progress Notes (Signed)
Patient's lip ring removed and given to mother.

## 2018-08-04 NOTE — Interval H&P Note (Signed)
History and Physical Interval Note:  08/04/2018 7:24 AM  Amanda Morales  has presented today for surgery, with the diagnosis of HNP and stenosis  The various methods of treatment have been discussed with the patient and family. After consideration of risks, benefits and other options for treatment, the patient has consented to  Procedure(s) with comments: Microlumbar decompression L3-4, Revision L4-5 (N/A) - 3 hrs as a surgical intervention .  The patient's history has been reviewed, patient examined, no change in status, stable for surgery.  I have reviewed the patient's chart and labs.  Questions were answered to the patient's satisfaction.     Sallie Staron C

## 2018-08-04 NOTE — Progress Notes (Signed)
Pharmacy consulted to dose vancomycin for surgical prophylaxis s/p Microlumbar decompression Lumbar three-four, Revision Lumbar four-five. Patient has PCN allergy and will receive one dose of vancomycin 1000mg  IV 12 hour from the initial preop dose given at 0650 this AM. No drains noted.   Pharmacy will sign off. Thank you for the consult.   Josselyn Harkins A. Jeanella CrazePierce, PharmD, BCPS Clinical Pharmacist [place] Pager: [phone] Please utilize Amion for appropriate phone number to reach the unit pharmacist [facility])

## 2018-08-04 NOTE — Brief Op Note (Signed)
08/04/2018  10:49 AM  PATIENT:  Hoover BrownsJessica N Dunton  32 y.o. female  PRE-OPERATIVE DIAGNOSIS:  HERNIATED NUCLEUS PULPOSUS and stenosis  POST-OPERATIVE DIAGNOSIS:  HERNIATED NUCLEUS PULPOSUS and stenosis  PROCEDURE:  Procedure(s): Microlumbar decompression Lumbar three-four, Revision Lumbar four-five (N/A)  SURGEON:  Surgeon(s) and Role:    Jene Every* Jackelynn Hosie, MD - Primary  PHYSICIAN ASSISTANT:   ASSISTANTS: Bissell;   ANESTHESIA:   general  EBL:  300 mL   BLOOD ADMINISTERED:none  DRAINS: none   LOCAL MEDICATIONS USED:  MARCAINE     SPECIMEN:  Source of Specimen:  L34  DISPOSITION OF SPECIMEN:  PATHOLOGY  COUNTS:  YES  TOURNIQUET:  * No tourniquets in log *  DICTATION: .Other Dictation: Dictation Number [id number]  PLAN OF CARE: Admit for overnight observation  PATIENT DISPOSITION:  PACU - hemodynamically stable.   Delay start of Pharmacological VTE agent (>24hrs) due to surgical blood loss or risk of bleeding: yes

## 2018-08-04 NOTE — Discharge Instructions (Signed)

## 2018-08-04 NOTE — Evaluation (Signed)
Occupational Therapy Evaluation Patient Details Name: Amanda Morales MRN: [id number] DOB: 11/21/1986 Today's Date: 08/04/2018    History of Present Illness Amanda Morales is a 32yo F who presents with herniated nucleus pulposis and stenosis and is now s/p Microlumbar decompression Lumbar three-four, Revision Lumbar four-five.    Clinical Impression   Pt admitted for spinal sx as described below. Reviewed adapted ways to perform LB ADLs and ADL transfers while adhering to back precautions. 16550ft of functional mobility with (S) performed. Pt with good carryover, limited mostly by post op pain. No further OT needs at this time, OT will sign off.     Follow Up Recommendations  No OT follow up    Equipment Recommendations  None recommended by OT    Recommendations for Other Services PT consult     Precautions / Restrictions Precautions Precautions: Back Precaution Booklet Issued: No Precaution Comments: verbally reviewed precautions Restrictions Weight Bearing Restrictions: No      Mobility Bed Mobility Overal bed mobility: Needs Assistance Bed Mobility: Rolling;Sidelying to Sit Rolling: Supervision Sidelying to sit: Supervision       General bed mobility comments: vc for log rolling  Transfers Overall transfer level: Needs assistance Equipment used: None Transfers: Sit to/from Stand Sit to Stand: Supervision         General transfer comment: vc for technique    Balance Overall balance assessment: Mild deficits observed, not formally tested                               ADL either performed or assessed with clinical judgement   ADL Overall ADL's : Needs assistance/impaired Eating/Feeding: Independent   Grooming: Wash/dry hands;Modified independent;Standing   Upper Body Bathing: Modified independent;Standing   Lower Body Bathing: Supervison/ safety;Cueing for back precautions;Sit to/from stand   Upper Body Dressing : Modified  independent;Sitting   Lower Body Dressing: Supervision/safety;Cueing for back precautions;Sit to/from Database administratorstand   Toilet Transfer: Supervision/safety;Regular Social workerToilet   Toileting- Clothing Manipulation and Hygiene: Supervision/safety;Sit to/from stand   Tub/ Shower Transfer: Supervision/safety;Grab bars;Walk-in shower   Functional mobility during ADLs: Supervision/safety General ADL Comments: 150 ft      Vision Baseline Vision/History: No visual deficits Patient Visual Report: No change from baseline Vision Assessment?: No apparent visual deficits     Perception     Praxis      Pertinent Vitals/Pain Pain Assessment: Faces Faces Pain Scale: Hurts a little bit Pain Location: L spine, sx site Pain Descriptors / Indicators: Aching Pain Intervention(s): Monitored during session     Hand Dominance Right   Extremity/Trunk Assessment Upper Extremity Assessment Upper Extremity Assessment: Overall WFL for tasks assessed   Lower Extremity Assessment Lower Extremity Assessment: Defer to PT evaluation   Cervical / Trunk Assessment Cervical / Trunk Assessment: Other exceptions Cervical / Trunk Exceptions: spine sx   Communication Communication Communication: No difficulties   Cognition Arousal/Alertness: Awake/alert Behavior During Therapy: WFL for tasks assessed/performed Overall Cognitive Status: Within Functional Limits for tasks assessed                                       Home Living Family/patient expects to be discharged to:: Private residence Living Arrangements: Spouse/significant other Available Help at Discharge: Family Type of Home: House Home Access: Level entry     Home Layout: One level     Bathroom Shower/Tub: Walk-in  shower;Tub/shower unit   Bathroom Toilet: Standard     Home Equipment: None          Prior Functioning/Environment Level of Independence: Independent        Comments: stopped working d/t back injury         OT Problem List: Pain         OT Goals(Current goals can be found in the care plan section) Acute Rehab OT Goals Patient Stated Goal: "to get back home OT Goal Formulation: All assessment and education complete, DC therapy   AM-PAC PT "6 Clicks" Daily Activity     Outcome Measure Help from another person eating meals?: None Help from another person taking care of personal grooming?: None Help from another person toileting, which includes using toliet, bedpan, or urinal?: A Little Help from another person bathing (including washing, rinsing, drying)?: A Little Help from another person to put on and taking off regular upper body clothing?: None Help from another person to put on and taking off regular lower body clothing?: A Little 6 Click Score: 21   End of Session Nurse Communication: Mobility status  Activity Tolerance: Patient tolerated treatment well Patient left: in bed;with family/visitor present  OT Visit Diagnosis: Unsteadiness on feet (R26.81)                Time: 1610-9604 OT Time Calculation (min): 13 min Charges:  OT General Charges $OT Visit: 1 Visit OT Evaluation $OT Eval Low Complexity: 1 Low   Crissie Reese OTR/L 08/04/2018, 3:59 PM

## 2018-08-04 NOTE — Transfer of Care (Signed)
Immediate Anesthesia Transfer of Care Note  Patient: Amanda Morales  Procedure(s) Performed: Microlumbar decompression Lumbar three-four, Revision Lumbar four-five (N/A )  Patient Location: PACU  Anesthesia Type:General  Level of Consciousness: awake, alert  and oriented  Airway & Oxygen Therapy: Patient Spontanous Breathing and Patient connected to face mask oxygen  Post-op Assessment: Report given to RN and Post -op Vital signs reviewed and stable  Post vital signs: Reviewed and stable  Last Vitals:  Vitals Value Taken Time  BP 146/96 08/04/2018 11:17 AM  Temp    Pulse 107 08/04/2018 11:20 AM  Resp 18 08/04/2018 11:20 AM  SpO2 99 % 08/04/2018 11:20 AM  Vitals shown include unvalidated device data.  Last Pain:  Vitals:   08/04/18 0632  TempSrc:   PainSc: 5       Patients Stated Pain Goal: 3 (08/04/18 [id number])  Complications: No apparent anesthesia complications

## 2018-08-05 ENCOUNTER — Encounter (HOSPITAL_COMMUNITY): Payer: Self-pay | Admitting: Specialist

## 2018-08-05 DIAGNOSIS — M5126 Other intervertebral disc displacement, lumbar region: Secondary | ICD-10-CM | POA: Diagnosis not present

## 2018-08-05 LAB — BASIC METABOLIC PANEL
Anion gap: 6 (ref 5–15)
BUN: 9 mg/dL (ref 6–20)
CALCIUM: 8.6 mg/dL — AB (ref 8.9–10.3)
CHLORIDE: 106 mmol/L (ref 98–111)
CO2: 25 mmol/L (ref 22–32)
Creatinine, Ser: 0.74 mg/dL (ref 0.44–1.00)
GFR calc non Af Amer: >60 mL/min (ref >60)
Glucose, Bld: 127 mg/dL — ABNORMAL HIGH (ref 70–99)
Potassium: 3.8 mmol/L (ref 3.5–5.1)
Sodium: 137 mmol/L (ref 135–145)

## 2018-08-05 LAB — CBC
HCT: 35.9 % — ABNORMAL LOW (ref 36.0–46.0)
Hemoglobin: 11.9 g/dL — ABNORMAL LOW (ref 12.0–15.0)
MCH: 32.2 pg (ref 26.0–34.0)
MCHC: 33.1 g/dL (ref 30.0–36.0)
MCV: 97.3 fL (ref 78.0–100.0)
PLATELETS: 180 10*3/uL (ref 150–400)
RBC: 3.69 MIL/uL — AB (ref 3.87–5.11)
RDW: 12.2 % (ref 11.5–15.5)
WBC: 19.2 10*3/uL — AB (ref 4.0–10.5)

## 2018-08-05 MED ORDER — OXYCODONE HCL 10 MG PO TABS: 10.0000 mg | ORAL_TABLET | ORAL | 0 refills | Status: DC | PRN

## 2018-08-05 NOTE — Progress Notes (Signed)
Subjective: 1 Day Post-Op Procedure(s) (LRB): Microlumbar decompression Lumbar three-four, Revision Lumbar four-five (N/A) Patient reports pain as mild and moderate.  Doing well. Reports incisional back pain. Leg pain and numbness improved but not yet resolved. Voiding without difficulty. No other c/o.  Objective: Vital signs in last 24 hours: Temp:  [97.2 F (36.2 C)-98.8 F (37.1 C)] 98 F (36.7 C) (08/30 0350) Pulse Rate:  [69-110] 81 (08/30 0350) Resp:  [12-25] 20 (08/30 0350) BP: (102-147)/(57-96) 113/72 (08/30 0350) SpO2:  [93 %-99 %] 98 % (08/30 0350)  Intake/Output from previous day: 08/29 0701 - 08/30 0700 In: 1157.6 [P.O.:120; I.V.:960.8; IV Piggyback:76.8] Out: 750 [Urine:450; Blood:300] Intake/Output this shift: No intake/output data recorded.  Recent Labs    08/05/18 0541  HGB 11.9*   Recent Labs    08/05/18 0541  WBC 19.2*  RBC 3.69*  HCT 35.9*  PLT 180   Recent Labs    08/05/18 0541  NA 137  K 3.8  CL 106  CO2 25  BUN 9  CREATININE 0.74  GLUCOSE 127*  CALCIUM 8.6*   No results for input(s): LABPT, INR in the last 72 hours.  Neurologically intact ABD soft Neurovascular intact Sensation intact distally Intact pulses distally Dorsiflexion/Plantar flexion intact Incision: dressing C/D/I and no drainage No cellulitis present Compartment soft no calf pain or sign of DVT  Assessment/Plan: 1 Day Post-Op Procedure(s) (LRB): Microlumbar decompression Lumbar three-four, Revision Lumbar four-five (N/A) Advance diet Up with therapy D/C IV fluids Discussed D/C instructions, dressing instructions, Lspine precautions Will D/C home today Discussed with Dr. Elissa LovettBeane   Amanda Morales, Amanda BarkerJACLYN Morales. 08/05/2018, 7:56 AM

## 2018-08-05 NOTE — Discharge Summary (Signed)
Physician Discharge Summary   Patient ID: Amanda Morales MRN: [id number] DOB/AGE: [birth year] 32 y.o.  Admit date: 08/04/2018 Discharge date: 08/05/2018  Primary Diagnosis:   HERNIATED NUCLEUS PULPOSUS and stenosis  Admission Diagnoses:  Past Medical History:  Diagnosis Date  . Anxiety   . Asthma    as a child  . Depression   . GERD (gastroesophageal reflux disease)    Discharge Diagnoses:   Principal Problem:   HNP (herniated nucleus pulposus), lumbar Active Problems:   Spinal stenosis at L4-L5 level  Procedure:  Procedure(s) (LRB): Microlumbar decompression Lumbar three-four, Revision Lumbar four-five (N/A)   Consults: None  HPI:  see H&P    Laboratory Data: Hospital Outpatient Visit on 07/29/2018  Component Date Value Ref Range Status  . MRSA, PCR 07/29/2018 NEGATIVE  NEGATIVE Final  . Staphylococcus aureus 07/29/2018 NEGATIVE  NEGATIVE Final   Comment: (NOTE) The Xpert SA Assay (FDA approved for NASAL specimens in patients 65 years of age and older), is one component of a comprehensive surveillance program. It is not intended to diagnose infection nor to guide or monitor treatment. Performed at [facility], [place] [address], [place], [place] [address]   . Sodium 07/29/2018 139  135 - 145 mmol/L Final  . Potassium 07/29/2018 4.0  3.5 - 5.1 mmol/L Final  . Chloride 07/29/2018 107  98 - 111 mmol/L Final  . CO2 07/29/2018 24  22 - 32 mmol/L Final  . Glucose, Bld 07/29/2018 95  70 - 99 mg/dL Final  . BUN 07/29/2018 10  6 - 20 mg/dL Final  . Creatinine, Ser 07/29/2018 0.69  0.44 - 1.00 mg/dL Final  . Calcium 07/29/2018 9.1  8.9 - 10.3 mg/dL Final  . GFR calc non Af Amer 07/29/2018 >60  >60 mL/min Final  . GFR calc Af Amer 07/29/2018 >60  >60 mL/min Final   Comment: (NOTE) The eGFR has been calculated using the CKD EPI equation. This calculation has not been validated in all clinical situations. eGFR's persistently <60 mL/min signify possible Chronic  Kidney Disease.   Georgiann Hahn gap 07/29/2018 8  5 - 15 Final   Performed at [facility], [place] [address] [place], [place] [address]  . WBC 07/29/2018 11.0* 4.0 - 10.5 K/uL Final  . RBC 07/29/2018 4.80  3.87 - 5.11 MIL/uL Final  . Hemoglobin 07/29/2018 15.5* 12.0 - 15.0 g/dL Final  . HCT 07/29/2018 46.8* 36.0 - 46.0 % Final  . MCV 07/29/2018 97.5  78.0 - 100.0 fL Final  . MCH 07/29/2018 32.3  26.0 - 34.0 pg Final  . MCHC 07/29/2018 33.1  30.0 - 36.0 g/dL Final  . RDW 07/29/2018 12.0  11.5 - 15.5 % Final  . Platelets 07/29/2018 235  150 - 400 K/uL Final   Performed at [facility], [place] [address], [place], [place] [address]   Recent Labs    08/05/18 0541  HGB 11.9*   Recent Labs    08/05/18 0541  WBC 19.2*  RBC 3.69*  HCT 35.9*  PLT 180   Recent Labs    08/05/18 0541  NA 137  K 3.8  CL 106  CO2 25  BUN 9  CREATININE 0.74  GLUCOSE 127*  CALCIUM 8.6*   No results for input(s): LABPT, INR in the last 72 hours.  X-Rays:Dg Lumbar Spine 2-3 Views  Result Date: 07/29/2018 CLINICAL DATA:  Preop evaluation for upcoming lumbar surgery EXAM: LUMBAR SPINE - 2 VIEW COMPARISON:  01/08/2018, 03/23/2005 FINDINGS:  Five lumbar type vertebral bodies are well visualized. Vertebral body height is well maintained. Osteophytic changes are noted at L4-5 with disc space narrowing. Mild disc space narrowing at L5-S1 is noted as well. No soft tissue abnormality is seen. IMPRESSION: Degenerative change.  No acute abnormality noted. Electronically Signed   By: Inez Catalina M.D.   On: 07/29/2018 16:35   Dg Lumbar Spine Complete  Result Date: 08/04/2018 CLINICAL DATA:  Lumbar fusion localization. EXAM: LUMBAR SPINE - COMPLETE 4+ VIEW COMPARISON:  07/29/2018 radiographs FINDINGS: Submitted intraoperative LATERAL postoperatively for interpretation views of the lumbar spine are. Film 1 demonstrates 2 metallic probes, 1 directed at the L4 spinous and the other directed at the S1 spinous process.  Film 2 demonstrates a posterior metallic probe directed towards the SUPERIOR aspect of the L3 vertebral body. Film 3 demonstrates metallic probes directed at L3-4 and L4-5. Film 4 demonstrates 2 posterior metallic probes, 1 directed at the SUPERIOR aspect of L4 and the other directed at the mid aspect of L4. IMPRESSION: Localizers as described Electronically Signed   By: Margarette Canada M.D.   On: 08/04/2018 13:57    EKG:No orders found for this or any previous visit.   Hospital Course: Patient was admitted to [facility] and taken to the OR and underwent the above state procedure without complications.  Patient tolerated the procedure well and was later transferred to the recovery room and then to the orthopaedic floor for postoperative care.  They were given PO and IV analgesics for pain control following their surgery.  They were given 24 hours of postoperative antibiotics.   PT was consulted postop to assist with mobility and transfers.  The patient was allowed to be WBAT with therapy and was taught back precautions. Discharge planning was consulted to help with postop disposition and equipment needs.  Patient had a  good night on the evening of surgery and started to get up OOB with therapy on day one. Patient was seen in rounds and was ready to go home on day one.  They were given discharge instructions and dressing directions.  They were instructed on when to follow up in the office with Dr. Tonita Cong.   Diet: Regular diet Activity:WBAT, Lspine precautions Follow-up:in 10-14 days Disposition - Home Discharged Condition: good   Discharge Instructions    Call MD / Call 911   Complete by:  As directed    If you experience chest pain or shortness of breath, CALL 911 and be transported to the hospital emergency room.  If you develope a fever above 101 F, pus (white drainage) or increased drainage or redness at the wound, or calf pain, call your surgeon's office.   Constipation Prevention    Complete by:  As directed    Drink plenty of fluids.  Prune juice may be helpful.  You may use a stool softener, such as Colace (over the counter) 100 mg twice a day.  Use MiraLax (over the counter) for constipation as needed.   Diet - low sodium heart healthy   Complete by:  As directed    Increase activity slowly as tolerated   Complete by:  As directed      Allergies as of 08/05/2018      Reactions   Penicillins Hives, Rash, Other (See Comments)   Has patient had a PCN reaction causing immediate rash, facial/tongue/throat swelling, SOB or lightheadedness with hypotension: unkn Has patient had a PCN reaction causing severe rash involving mucus membranes or skin  necrosis: unkn PATIENT HAS HAD A PCN REACTION THAT REQUIRED HOSPITALIZATION:  #  #  YES  #  #  Has patient had a PCN reaction occurring within the last 10 years: #  #  #  YES  #  #  #  If all of the above answers are "NO", then may proceed with Cephalosporin use.      Medication List    TAKE these medications   clonazePAM 1 MG tablet Commonly known as:  KLONOPIN Take 1 mg by mouth daily as needed for anxiety.   docusate sodium 100 MG capsule Commonly known as:  COLACE Take 1 capsule (100 mg total) by mouth 2 (two) times daily.   FLUoxetine 10 MG tablet Commonly known as:  PROZAC Take 20 mg by mouth daily.   methocarbamol 500 MG tablet Commonly known as:  ROBAXIN Take 1 tablet (500 mg total) by mouth every 6 (six) hours as needed for muscle spasms.   Oxycodone HCl 10 MG Tabs Take 1 tablet (10 mg total) by mouth every 3 (three) hours as needed for severe pain ((score 7 to 10)).   polyethylene glycol packet Commonly known as:  MIRALAX / GLYCOLAX Take 17 g by mouth daily.      Follow-up Information    Susa Day, MD Follow up in 2 week(s).   Specialty:  Orthopedic Surgery Contact information: [address] [place] [address] [phone]           Signed: Lacie Draft,  PA-C Orthopaedic Surgery 08/05/2018, 8:01 AM

## 2018-08-05 NOTE — Progress Notes (Signed)
Patient is discharged from room 3C07 at this time. Alert and in stable condition. IV site d/c'd and instructions read to patient and mother with understanding verbalized. Left unit via wheelchair with all belongings at side. 

## 2018-08-10 MED FILL — Thrombin (Recombinant) For Soln 20000 Unit: CUTANEOUS | Qty: 1 | Status: AC

## 2018-08-23 ENCOUNTER — Other Ambulatory Visit: Payer: Self-pay

## 2018-08-23 ENCOUNTER — Ambulatory Visit (HOSPITAL_COMMUNITY): Payer: Commercial Managed Care - PPO | Attending: Specialist

## 2018-08-23 ENCOUNTER — Encounter (HOSPITAL_COMMUNITY): Payer: Self-pay

## 2018-08-23 DIAGNOSIS — M6281 Muscle weakness (generalized): Secondary | ICD-10-CM

## 2018-08-23 DIAGNOSIS — G8929 Other chronic pain: Secondary | ICD-10-CM | POA: Diagnosis present

## 2018-08-23 DIAGNOSIS — R29898 Other symptoms and signs involving the musculoskeletal system: Secondary | ICD-10-CM | POA: Diagnosis present

## 2018-08-23 DIAGNOSIS — M5441 Lumbago with sciatica, right side: Secondary | ICD-10-CM | POA: Insufficient documentation

## 2018-08-23 DIAGNOSIS — R29818 Other symptoms and signs involving the nervous system: Secondary | ICD-10-CM | POA: Diagnosis present

## 2018-08-23 NOTE — Therapy (Signed)
[facility] [address] [place], [place], [address] Phone: [phone]   Fax:  [phone]  Physical Therapy Evaluation  Patient Details  Name: Amanda Morales MRN: [id number] Date of Birth: 1986-09-13 Referring Provider: Jene Every, MD   Encounter Date: 08/23/2018  PT End of Session - 08/23/18 1210    Visit Number  1    Number of Visits  13    Date for PT Re-Evaluation  10/04/18   mini reassessment 09/13/18   Authorization Type  [employer] PPO (after 25th visit, medical necessity review)    Authorization Time Period  08/23/18 to 10/04/18    Authorization - Visit Number  1    Authorization - Number of Visits  25    PT Start Time  0958   arrived late   PT Stop Time  1030    PT Time Calculation (min)  32 min    Activity Tolerance  Patient tolerated treatment well    Behavior During Therapy  [facility] for tasks assessed/performed       Past Medical History:  Diagnosis Date  . Anxiety   . Asthma    as a child  . Depression   . GERD (gastroesophageal reflux disease)     Past Surgical History:  Procedure Laterality Date  . BACK SURGERY    . LUMBAR LAMINECTOMY/DECOMPRESSION MICRODISCECTOMY N/A 08/04/2018   Procedure: Microlumbar decompression Lumbar three-four, Revision Lumbar four-five;  Surgeon: Jene Every, MD;  Location: [facility] OR;  Service: Orthopedics;  Laterality: N/A;  . TONSILLECTOMY    . WISDOM TOOTH EXTRACTION      There were no vitals filed for this visit.   Subjective Assessment - 08/23/18 0959    Subjective  Pt reports undergoing a 2nd lumbar surgery on 08/04/18 by Dr. Jene Every. Her first surgery was when she was 32YO s/p falling and causing herniated disc. Her 2nd surgery was also due to a fall and herniating a disc as well as RLE deficits. She reports RLE n/t prior to and since her surgery. She also reports RLE weakness prior to and since the surgery but denies any b/b issues. She states that she's having the most  difficutly with pain in her lower back, being off balance due to RLE numbness, and intermittently RLE leg pain. 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 She can't stand or walk for a long period of time due to back pain and leg weakness.     Limitations  Lifting;Standing;Walking;House hold activities    How long can you sit comfortably?  20 mins    How long can you stand comfortably?  15 mins    How long can you walk comfortably?  20 mins    Patient Stated Goals  get feeling back in RLE    Currently in Pain?  Yes    Pain Score  3     Pain Location  Back    Pain Orientation  Lower;Right    Pain Descriptors / Indicators  Aching;Dull    Pain Type  Surgical pain    Pain Onset  1 to 4 weeks ago    Pain Frequency  Constant    Aggravating Factors   standing, walking    Pain Relieving Factors  nothing really    Effect of Pain on Daily Activities  increases         [facility] PT Assessment - 08/23/18 0001      Assessment   Medical  Diagnosis  low back surgery    Referring Provider  Jene EveryJeffrey Beane, MD    Onset Date/Surgical Date  08/04/18   lumbar surgery   Next MD Visit  09/15/18    Prior Therapy  none      Precautions   Precautions  Back    Precaution Comments  BLT      Balance Screen   Has the patient fallen in the past 6 months  Yes    How many times?  1   fall at work which led to surgery, around 01/08/18   Has the patient had a decrease in activity level because of a fear of falling?   Yes    Is the patient reluctant to leave their home because of a fear of falling?   No      Prior Function   Level of Independence  Independent    Vocation  Full time employment    Vocation Requirements  CNA, lifting, pulling, pushing patients    Leisure  walking dogs, cookouts      Observation/Other Assessments   Focus on Therapeutic Outcomes (FOTO)   61% limitation      Sensation   Light Touch  Impaired Detail    Light Touch Impaired Details  Impaired RLE   duller on R  throughout   Additional Comments  DTRs 2+ BLE; sharp/dull impaired on plantar aspect of R foot, missed about 50% of the test      Functional Tests   Functional tests  Sit to Stand      Sit to Stand   Comments  30 sec chair rise test: 9x (increased LBP, using LLE more)      Posture/Postural Control   Posture/Postural Control  Postural limitations    Postural Limitations  Rounded Shoulders;Forward head;Increased thoracic kyphosis      ROM / Strength   AROM / PROM / Strength  Strength      Strength   Strength Assessment Site  Hip;Knee;Ankle    Right Hip Flexion  4+/5    Right Hip Extension  3+/5    Right Hip ABduction  3/5    Left Hip Flexion  5/5    Left Hip Extension  4/5    Left Hip ABduction  4+/5    Right Knee Flexion  4/5    Right Knee Extension  4/5    Left Knee Flexion  4+/5    Left Knee Extension  5/5    Right Ankle Dorsiflexion  3/5    Left Ankle Dorsiflexion  5/5      Flexibility   Soft Tissue Assessment /Muscle Length  yes    Hamstrings  R tighter than L      Palpation   Spinal mobility  not assessed; incision appeared well healing without signs of infection    Palpation comment  increased restricitons in bil lumbar paraspinals and along scar R>L, which recreated pain; restirctions in bil glutes, piriformis R>L       Special Tests    Special Tests  Lumbar    Lumbar Tests  Straight Leg Raise      Straight Leg Raise   Findings  Positive    Side   Right    Comment  35deg increased RLE n/t; neg L      Ambulation/Gait   Ambulation Distance (Feet)  486 Feet   3MWT   Assistive device  None    Gait Pattern  Step-through pattern;Decreased stance time - right;Decreased dorsiflexion - right;Trendelenburg;Antalgic  Balance   Balance Assessed  Yes      Static Standing Balance   Static Standing - Balance Support  No upper extremity supported    Static Standing Balance -  Activities   Single Leg Stance - Right Leg;Single Leg Stance - Left Leg    Static  Standing - Comment/# of Minutes  R: 4sec or < L: 38sec            Objective measurements completed on examination: See above findings.          PT Education - 08/23/18 1210    Education Details  exam findings, POC, HEP    Person(s) Educated  Patient    Methods  Explanation;Demonstration;Handout    Comprehension  Verbalized understanding;Returned demonstration       PT Short Term Goals - 08/23/18 1215      PT SHORT TERM GOAL #1   Title  Pt will have 1/2 grade improvement throughout MMT in order to decrease pain and maximize functional mobility.    Time  3    Period  Weeks    Status  New    Target Date  09/13/18      PT SHORT TERM GOAL #2   Title  Pt will be able to perform R SLS for 8sec in order to demo improved core and hip strength to maximize gait and stair ambulation.    Time  3    Period  Weeks    Status  New      PT SHORT TERM GOAL #3   Title  Pt will have improved 30 sec chair rise test to 12x in order to demo improved functional strength and balance.    Time  3    Period  Weeks    Status  New        PT Long Term Goals - 08/23/18 1215      PT LONG TERM GOAL #1   Title  Pt will have 1 grade improvement throughout MMT in order to further decrease pain and maximize balance and gait.     Time  6    Period  Weeks    Status  New    Target Date  10/04/18      PT LONG TERM GOAL #2   Title  Pt will be able to perform R SLS for 15 sec or > in order to demo improved RLE sensation, decreased LBP, and improved core strength.    Time  6    Period  Weeks    Status  New      PT LONG TERM GOAL #3   Title  Pt will have improved 30 sec chair rise test to 15x in order to further demo improved functional strength and maximize her transfers.     Time  6    Period  Weeks    Status  New      PT LONG TERM GOAL #4   Title  Pt will have 171ft improvement in without increases in pain and gait WFL in order to demo improved functional mobility and maximize her  tolerance to gait.    Time  6    Period  Weeks    Status  New      PT LONG TERM GOAL #5   Title  Pt will report being able to stand and sit for 30 mins each without requiring rest break or increases in LBP or RLE pain in order to allow her to prepare  and enjoy a meal with her family.    Time  6    Period  Weeks    Status  New             Plan - 08/23/18 1212    Clinical Impression Statement  Pt is pleasant 32YO F who presents to OPPT s/p central decompression L3-4 with L3-4 foraminotomies and revision lumbar decompression L4-5, currently on BLT precautions until she f/u with Dr. Shelle Iron in October. Prior to surgery, pt had RLE n/t and weakness but no b/b symptoms. Post-op, pt has deficits in RLE sensation, MMT, R>L, pain, balance, gait, core strength, functional strength, and functional mobility. Pt with difficulty identifying sharp touch during sharp/dull on plantar aspect of foot and her R ankle DF and R EHL were especially weak during MMT. Pt limited in gait due to RLE and lower back pain. She also had increased soft tissue restrictions of bil lumbar paraspinals, R>L, and palpation to R caused referred pain into RLE. Pt needs skilled PT intervention to address these impairments in order to decrease her pain, improve strength, and maximize return to PLOF.     History and Personal Factors relevant to plan of care:  2nd lumbar surgery, overall healthy 32YO F    Clinical Presentation  Stable    Clinical Presentation due to:  MMT, core strength, funcitonal strength, balance, gait, , 30 sec chair rise test, SLS, RLE sensation    Clinical Decision Making  Low    Rehab Potential  Good    PT Frequency  2x / week    PT Duration  6 weeks    PT Treatment/Interventions  ADLs/Self Care Home Management;Aquatic Therapy;Biofeedback;Cryotherapy;Electrical Stimulation;Moist Heat;Ultrasound;DME Instruction;Gait training;Stair training;Functional mobility training;Therapeutic activities;Therapeutic  exercise;Balance training;Neuromuscular re-education;Patient/family education;Manual techniques;Scar mobilization;Passive range of motion;Dry needling;Taping    PT Next Visit Plan  review goals and HEP; initiate core, BLE, funcitonal strengthening, balance training, manual for soft tissue/scar tissue in lumbar spine and/or glutes    PT Home Exercise Plan  eval: seated toe raises, standing heel raises, STS    Consulted and Agree with Plan of Care  Patient       Patient will benefit from skilled therapeutic intervention in order to improve the following deficits and impairments:  Abnormal gait, Decreased activity tolerance, Decreased balance, Decreased endurance, Decreased mobility, Decreased range of motion, Decreased scar mobility, Decreased strength, Difficulty walking, Hypomobility, Increased muscle spasms, Impaired flexibility, Increased fascial restricitons, Impaired sensation, Improper body mechanics, Postural dysfunction, Pain  Visit Diagnosis: Chronic bilateral low back pain with right-sided sciatica - Plan: PT plan of care cert/re-cert  Muscle weakness (generalized) - Plan: PT plan of care cert/re-cert  Other symptoms and signs involving the musculoskeletal system - Plan: PT plan of care cert/re-cert  Other symptoms and signs involving the nervous system - Plan: PT plan of care cert/re-cert     Problem List Patient Active Problem List   Diagnosis Date Noted  . HNP (herniated nucleus pulposus), lumbar 08/04/2018  . Spinal stenosis at L4-L5 level 08/04/2018       Jac Canavan PT, DPT  [facility] [address] [place], [place], [address] Phone: [phone]   Fax:  [phone]  Name: AKIA MONTALBAN MRN: [id number] Date of Birth: 08/10/1986

## 2018-08-23 NOTE — Patient Instructions (Signed)
Access Code: [email or web address]  URL: [email or web address]  Date: 08/23/2018  Prepared by: Jac CanavanBrooke Halford Goetzke   Exercises Heel Raises with Unilateral Counter Support - 10 reps - 3 sets - 1x daily - 7x weekly Seated Toe Raises - 10 reps - 3 sets - 1x daily - 7x weekly Sit to Stand - 10 reps - 3 sets - 1x daily - 7x weekly

## 2018-08-25 ENCOUNTER — Telehealth (HOSPITAL_COMMUNITY): Payer: Self-pay

## 2018-08-25 ENCOUNTER — Ambulatory Visit (HOSPITAL_COMMUNITY): Payer: Commercial Managed Care - PPO

## 2018-08-25 NOTE — Telephone Encounter (Signed)
Patient left a message to cancel her appt for today. °

## 2018-08-31 ENCOUNTER — Telehealth (HOSPITAL_COMMUNITY): Payer: Self-pay

## 2018-08-31 ENCOUNTER — Ambulatory Visit (HOSPITAL_COMMUNITY): Payer: Commercial Managed Care - PPO

## 2018-08-31 NOTE — Telephone Encounter (Signed)
No show, called and went straight to voice mail.  Left message concerning missed apt today.  Reminded next apt date and time with contact info given.    [address], LPTA; CBIS [phone]

## 2018-09-02 ENCOUNTER — Encounter (HOSPITAL_COMMUNITY): Payer: Self-pay

## 2018-09-02 ENCOUNTER — Ambulatory Visit (HOSPITAL_COMMUNITY): Payer: Commercial Managed Care - PPO

## 2018-09-02 DIAGNOSIS — M5441 Lumbago with sciatica, right side: Principal | ICD-10-CM

## 2018-09-02 DIAGNOSIS — R29898 Other symptoms and signs involving the musculoskeletal system: Secondary | ICD-10-CM

## 2018-09-02 DIAGNOSIS — M6281 Muscle weakness (generalized): Secondary | ICD-10-CM

## 2018-09-02 DIAGNOSIS — G8929 Other chronic pain: Secondary | ICD-10-CM

## 2018-09-02 DIAGNOSIS — R29818 Other symptoms and signs involving the nervous system: Secondary | ICD-10-CM

## 2018-09-02 NOTE — Patient Instructions (Signed)
Bridge    Lie back, legs bent. Inhale, pressing hips up. Keeping ribs in, lengthen lower back. Exhale, rolling down along spine from top. Repeat 10-15 times. Do 1-2 sessions per day.  http://pm.exer.us/55   Copyright  VHI. All rights reserved.   Single Leg Balance: Eyes Open    Stand on right leg with eyes open. Hold 30 seconds. 5 times per day.  http://ggbe.exer.us/5   Copyright  VHI. All rights reserved.

## 2018-09-02 NOTE — Therapy (Signed)
[facility] [address] [place], [place], [address] Phone: [phone]   Fax:  [phone]  Physical Therapy Treatment  Patient Details  Name: Amanda Morales MRN: [id number] Date of Birth: 1986/07/31 Referring Provider (PT): Jene Every, MD   Encounter Date: 09/02/2018  PT End of Session - 09/02/18 1044    Visit Number  2    Number of Visits  13    Date for PT Re-Evaluation  10/04/18   MInireassess 09/13/18   Authorization Type  [employer] PPO (after 25th visit, medical necessity review)    Authorization Time Period  08/23/18 to 10/04/18    Authorization - Visit Number  2    Authorization - Number of Visits  25    PT Start Time  1033    PT Stop Time  1115    PT Time Calculation (min)  42 min    Activity Tolerance  Patient tolerated treatment well    Behavior During Therapy  [facility] for tasks assessed/performed       Past Medical History:  Diagnosis Date  . Anxiety   . Asthma    as a child  . Depression   . GERD (gastroesophageal reflux disease)     Past Surgical History:  Procedure Laterality Date  . BACK SURGERY    . LUMBAR LAMINECTOMY/DECOMPRESSION MICRODISCECTOMY N/A 08/04/2018   Procedure: Microlumbar decompression Lumbar three-four, Revision Lumbar four-five;  Surgeon: Jene Every, MD;  Location: [facility] OR;  Service: Orthopedics;  Laterality: N/A;  . TONSILLECTOMY    . WISDOM TOOTH EXTRACTION      There were no vitals filed for this visit.  Subjective Assessment - 09/02/18 1031    Subjective  Pt stated she continues to have numbness down Rt LE especially the foot and ankle area.  Reports she fell Monday walking towards her mother's backdoor, was able to stand up independently, feels due to Rt LE weakness.  Has been compliant with HEP daily without questions.  Current pain scale 5/10 LBP, soreness and stiffness.  Aware of BLT precautions and limited with 10# lifting.      Patient Stated Goals  get feeling back in RLE    Currently in Pain?  Yes    Pain Score  5     Pain Location  Back    Pain Orientation  Lower;Right    Pain Descriptors / Indicators  Aching;Dull;Tightness    Pain Type  Surgical pain    Pain Onset  1 to 4 weeks ago    Pain Frequency  Constant    Aggravating Factors   standing, walking, worse at night    Pain Relieving Factors  nothing really    Effect of Pain on Daily Activities  increases                       [facility] Adult PT Treatment/Exercise - 09/02/18 0001      Bed Mobility   Bed Mobility  Left Sidelying to Sit;Sit to Sidelying Left    Left Sidelying to Sit  Supervision/Verbal cueing   cueing for proper bed mobility   Sit to Sidelying Left  Supervision/Verbal cueing   cueing for proper bed mobility     Exercises   Exercises  Lumbar      Lumbar Exercises: Standing   Heel Raises  15 reps    Other Standing Lumbar Exercises  SLS Lt 23", Rt 10" max of 5    Other Standing Lumbar Exercises  tandem stance on foam 2x 30"      Lumbar Exercises: Seated   Sit to Stand  10 reps    Sit to Stand Limitations  no HHA    Other Seated Lumbar Exercises  Toe raises 10x      Lumbar Exercises: Supine   Ab Set  10 reps;5 seconds    AB Set Limitations  cueing for proper activaiton    Bent Knee Raise  10 reps;5 seconds    Bent Knee Raise Limitations  with ab set    Bridge  10 reps   2 sets     Lumbar Exercises: Sidelying   Clam  10 reps;3 seconds;Both    Clam Limitations  RTB    Hip Abduction  10 reps    Hip Abduction Limitations  cueing for form             PT Education - 09/02/18 1052    Education Details  Reviewed goals, assured complaince iwht HEP and copy of eval given to pt.    Reviewed mechanics iwht bed mobility, log rolling.    Person(s) Educated  Patient    Methods  Explanation;Demonstration;Handout    Comprehension  Verbalized understanding;Returned demonstration       PT Short Term Goals - 08/23/18 1215      PT SHORT TERM GOAL #1   Title  Pt  will have 1/2 grade improvement throughout MMT in order to decrease pain and maximize functional mobility.    Time  3    Period  Weeks    Status  New    Target Date  09/13/18      PT SHORT TERM GOAL #2   Title  Pt will be able to perform R SLS for 8sec in order to demo improved core and hip strength to maximize gait and stair ambulation.    Time  3    Period  Weeks    Status  New      PT SHORT TERM GOAL #3   Title  Pt will have improved 30 sec chair rise test to 12x in order to demo improved functional strength and balance.    Time  3    Period  Weeks    Status  New        PT Long Term Goals - 08/23/18 1215      PT LONG TERM GOAL #1   Title  Pt will have 1 grade improvement throughout MMT in order to further decrease pain and maximize balance and gait.     Time  6    Period  Weeks    Status  New    Target Date  10/04/18      PT LONG TERM GOAL #2   Title  Pt will be able to perform R SLS for 15 sec or > in order to demo improved RLE sensation, decreased LBP, and improved core strength.    Time  6    Period  Weeks    Status  New      PT LONG TERM GOAL #3   Title  Pt will have improved 30 sec chair rise test to 15x in order to further demo improved functional strength and maximize her transfers.     Time  6    Period  Weeks    Status  New      PT LONG TERM GOAL #4   Title  Pt will have 110ft improvement in without increases in pain and  gait WFL in order to demo improved functional mobility and maximize her tolerance to gait.    Time  6    Period  Weeks    Status  New      PT LONG TERM GOAL #5   Title  Pt will report being able to stand and sit for 30 mins each without requiring rest break or increases in LBP or RLE pain in order to allow her to prepare and enjoy a meal with her family.    Time  6    Period  Weeks    Status  New            Plan - 09/02/18 1107    Clinical Impression Statement  Reviewed goals, assured proper form and compliance wiht HEP  and copy of eval given to pt.  Session focus on core and proximal strengthening therex to improve lumbar stability.  Pt able to demonstrate appropriate form and mechanics following initial cueing with minimal difficulty, mainly Rt LE weakness with tasks.  No reports of increased pain through session, noted visible musculature fatigue wiht activities.      Rehab Potential  Good    PT Frequency  2x / week    PT Duration  6 weeks    PT Treatment/Interventions  ADLs/Self Care Home Management;Aquatic Therapy;Biofeedback;Cryotherapy;Electrical Stimulation;Moist Heat;Ultrasound;DME Instruction;Gait training;Stair training;Functional mobility training;Therapeutic activities;Therapeutic exercise;Balance training;Neuromuscular re-education;Patient/family education;Manual techniques;Scar mobilization;Passive range of motion;Dry needling;Taping    PT Next Visit Plan  Next session begin supine dead bug, quadruped UE/LE, vector stance, and palvo in tandem stance.  Continue core and LE functional strengthening, balance training and manual for soft tissue/scar tissue in lumbar spine and/or glutes.    PT Home Exercise Plan  eval: seated toe raises, standing heel raises, STS 09/02/18: bridge and SLS       Patient will benefit from skilled therapeutic intervention in order to improve the following deficits and impairments:  Abnormal gait, Decreased activity tolerance, Decreased balance, Decreased endurance, Decreased mobility, Decreased range of motion, Decreased scar mobility, Decreased strength, Difficulty walking, Hypomobility, Increased muscle spasms, Impaired flexibility, Increased fascial restricitons, Impaired sensation, Improper body mechanics, Postural dysfunction, Pain  Visit Diagnosis: Chronic bilateral low back pain with right-sided sciatica  Muscle weakness (generalized)  Other symptoms and signs involving the musculoskeletal system  Other symptoms and signs involving the nervous system     Problem  List Patient Active Problem List   Diagnosis Date Noted  . HNP (herniated nucleus pulposus), lumbar 08/04/2018  . Spinal stenosis at L4-L5 level 08/04/2018   Becky Sax, LPTA; CBIS [phone]  Juel Burrow 09/02/2018, 12:31 PM  [facility] [address] [place], [place], [address] Phone: [phone]   Fax:  [phone]  Name: KATHYLEEN RADICE MRN: [id number] Date of Birth: 03/05/1986

## 2018-09-06 ENCOUNTER — Ambulatory Visit (HOSPITAL_COMMUNITY): Payer: Self-pay | Attending: Specialist | Admitting: Physical Therapy

## 2018-09-06 DIAGNOSIS — R29898 Other symptoms and signs involving the musculoskeletal system: Secondary | ICD-10-CM | POA: Insufficient documentation

## 2018-09-06 DIAGNOSIS — R29818 Other symptoms and signs involving the nervous system: Secondary | ICD-10-CM | POA: Insufficient documentation

## 2018-09-06 DIAGNOSIS — M6281 Muscle weakness (generalized): Secondary | ICD-10-CM | POA: Insufficient documentation

## 2018-09-06 DIAGNOSIS — G8929 Other chronic pain: Secondary | ICD-10-CM | POA: Insufficient documentation

## 2018-09-06 DIAGNOSIS — M5441 Lumbago with sciatica, right side: Secondary | ICD-10-CM | POA: Insufficient documentation

## 2018-09-06 NOTE — Therapy (Signed)
[facility] [address] [place], [place], [address] Phone: [phone]   Fax:  [phone]  Physical Therapy Treatment  Patient Details  Name: Amanda Morales MRN: [id number] Date of Birth: 1986-05-31 Referring Provider (PT): Jene Every, MD   Encounter Date: 09/06/2018  PT End of Session - 09/06/18 1140    Visit Number  3    Number of Visits  13    Date for PT Re-Evaluation  10/04/18   MInireassess 09/13/18   Authorization Type  [employer] PPO (after 25th visit, medical necessity review)    Authorization Time Period  08/23/18 to 10/04/18    Authorization - Visit Number  3    Authorization - Number of Visits  25    PT Start Time  1117    PT Stop Time  1155    PT Time Calculation (min)  38 min    Activity Tolerance  Patient tolerated treatment well    Behavior During Therapy  [facility] for tasks assessed/performed       Past Medical History:  Diagnosis Date  . Anxiety   . Asthma    as a child  . Depression   . GERD (gastroesophageal reflux disease)     Past Surgical History:  Procedure Laterality Date  . BACK SURGERY    . LUMBAR LAMINECTOMY/DECOMPRESSION MICRODISCECTOMY N/A 08/04/2018   Procedure: Microlumbar decompression Lumbar three-four, Revision Lumbar four-five;  Surgeon: Jene Every, MD;  Location: [facility] OR;  Service: Orthopedics;  Laterality: N/A;  . TONSILLECTOMY    . WISDOM TOOTH EXTRACTION      There were no vitals filed for this visit.  Subjective Assessment - 09/06/18 1123    Subjective  Pt states she is hurting pretty bad today in Rt LE down on medial side to foot.  7/10 pain currently.      Currently in Pain?  Yes    Pain Score  7     Pain Location  Leg    Pain Orientation  Right;Lower;Medial    Pain Descriptors / Indicators  Aching;Burning    Pain Radiating Towards  radiating to Rt foot                       [facility] Adult PT Treatment/Exercise - 09/06/18 0001      Lumbar Exercises: Stretches    Piriformis Stretch  Right;Left;2 reps;30 seconds;Limitations    Piriformis Stretch Limitations  seated      Lumbar Exercises: Standing   Heel Raises  15 reps    Lifting Limitations  hip abduction, hip extension 10 reps each with UE assist    Forward Lunge  10 reps;Limitations    Forward Lunge Limitations  4" step no UE assist    Push / Pull Sled  Palov press with RTB tandem no foam 10 reps each position each way (40 reps total)    Other Standing Lumbar Exercises  SLS Lt 30", Rt 8" max of 5;  Vector stance Rt only with 1 UE assist 5X5"    Other Standing Lumbar Exercises  tandem stance on foam 10 reps each 2# bar UE flexion      Lumbar Exercises: Seated   Sit to Stand  10 reps    Sit to Stand Limitations  no HHA      Lumbar Exercises: Supine   Ab Set  5 seconds;15 reps    Bent Knee Raise  5 seconds;15 reps    Bent Knee Raise Limitations  with ab set    Bridge  15 reps    Straight Leg Raise  10 reps;Limitations    Straight Leg Raises Limitations  bilaterally with core stab      Lumbar Exercises: Sidelying   Clam  10 reps;3 seconds;Both    Clam Limitations  RTB    Hip Abduction  10 reps               PT Short Term Goals - 08/23/18 1215      PT SHORT TERM GOAL #1   Title  Pt will have 1/2 grade improvement throughout MMT in order to decrease pain and maximize functional mobility.    Time  3    Period  Weeks    Status  New    Target Date  09/13/18      PT SHORT TERM GOAL #2   Title  Pt will be able to perform R SLS for 8sec in order to demo improved core and hip strength to maximize gait and stair ambulation.    Time  3    Period  Weeks    Status  New      PT SHORT TERM GOAL #3   Title  Pt will have improved 30 sec chair rise test to 12x in order to demo improved functional strength and balance.    Time  3    Period  Weeks    Status  New        PT Long Term Goals - 08/23/18 1215      PT LONG TERM GOAL #1   Title  Pt will have 1 grade improvement  throughout MMT in order to further decrease pain and maximize balance and gait.     Time  6    Period  Weeks    Status  New    Target Date  10/04/18      PT LONG TERM GOAL #2   Title  Pt will be able to perform R SLS for 15 sec or > in order to demo improved RLE sensation, decreased LBP, and improved core strength.    Time  6    Period  Weeks    Status  New      PT LONG TERM GOAL #3   Title  Pt will have improved 30 sec chair rise test to 15x in order to further demo improved functional strength and maximize her transfers.     Time  6    Period  Weeks    Status  New      PT LONG TERM GOAL #4   Title  Pt will have 153ft improvement in without increases in pain and gait WFL in order to demo improved functional mobility and maximize her tolerance to gait.    Time  6    Period  Weeks    Status  New      PT LONG TERM GOAL #5   Title  Pt will report being able to stand and sit for 30 mins each without requiring rest break or increases in LBP or RLE pain in order to allow her to prepare and enjoy a meal with her family.    Time  6    Period  Weeks    Status  New            Plan - 09/06/18 1151    Clinical Impression Statement  contineued with lumbar stab and proximal strengthening.  Began various standing exercises with most  diffiucult being palov press.  Pt required verbal and tactile cues with all exercises to complete correctly.  Conitnues to have challenge maintaining SLS on Rt, no greater than 8 seconds without UE assist.  Pt instructed with piriformis stretch in seated position.  Pt with slight increase in pain from new therex at end of session, however reports this is common and will go down.     Rehab Potential  Good    PT Frequency  2x / week    PT Duration  6 weeks    PT Treatment/Interventions  ADLs/Self Care Home Management;Aquatic Therapy;Biofeedback;Cryotherapy;Electrical Stimulation;Moist Heat;Ultrasound;DME Instruction;Gait training;Stair training;Functional  mobility training;Therapeutic activities;Therapeutic exercise;Balance training;Neuromuscular re-education;Patient/family education;Manual techniques;Scar mobilization;Passive range of motion;Dry needling;Taping    PT Next Visit Plan  Next session begin quadruped UE/LE and squats.  PRogress difficulty of palov using foam when stability improves.     PT Home Exercise Plan  eval: seated toe raises, standing heel raises, STS 09/02/18: bridge and SLS       Patient will benefit from skilled therapeutic intervention in order to improve the following deficits and impairments:  Abnormal gait, Decreased activity tolerance, Decreased balance, Decreased endurance, Decreased mobility, Decreased range of motion, Decreased scar mobility, Decreased strength, Difficulty walking, Hypomobility, Increased muscle spasms, Impaired flexibility, Increased fascial restricitons, Impaired sensation, Improper body mechanics, Postural dysfunction, Pain  Visit Diagnosis: Chronic bilateral low back pain with right-sided sciatica  Muscle weakness (generalized)  Other symptoms and signs involving the musculoskeletal system  Other symptoms and signs involving the nervous system     Problem List Patient Active Problem List   Diagnosis Date Noted  . HNP (herniated nucleus pulposus), lumbar 08/04/2018  . Spinal stenosis at L4-L5 level 08/04/2018   Amanda Morales, PTA/CLT [phone]  Amanda Morales 09/06/2018, 12:04 PM  [facility] [address] [place], [place], [address] Phone: [phone]   Fax:  [phone]  Name: Amanda Morales MRN: [id number] Date of Birth: 10-03-1986

## 2018-09-08 ENCOUNTER — Telehealth (HOSPITAL_COMMUNITY): Payer: Self-pay | Admitting: Physical Therapy

## 2018-09-08 ENCOUNTER — Ambulatory Visit (HOSPITAL_COMMUNITY): Payer: Self-pay | Admitting: Physical Therapy

## 2018-09-08 NOTE — Telephone Encounter (Signed)
Pt did not show for appointment.  Called number given and spoke to gentleman who stated he would relay the message regarding missed appt and reminder for next appt Tuesday at 1:45 Lurena Nida, PTA/CLT [phone]

## 2018-09-13 ENCOUNTER — Ambulatory Visit (HOSPITAL_COMMUNITY): Payer: Self-pay

## 2018-09-13 ENCOUNTER — Telehealth (HOSPITAL_COMMUNITY): Payer: Self-pay

## 2018-09-13 NOTE — Telephone Encounter (Signed)
No show; called and spoke to gentleman on the phone regarding pt's no show; he stated that she was still asleep but that he would relay the message to her about the missed appointment and remind her of her next scheduled appointment.     Jac Canavan PT, DPT

## 2018-09-15 ENCOUNTER — Telehealth (HOSPITAL_COMMUNITY): Payer: Self-pay

## 2018-09-15 ENCOUNTER — Ambulatory Visit (HOSPITAL_COMMUNITY): Payer: Self-pay

## 2018-09-15 NOTE — Telephone Encounter (Signed)
Patient called to cancel,she have another appt scheduled today and will see [place] next week.

## 2018-09-20 ENCOUNTER — Telehealth (HOSPITAL_COMMUNITY): Payer: Self-pay

## 2018-09-20 ENCOUNTER — Ambulatory Visit (HOSPITAL_COMMUNITY): Payer: Self-pay

## 2018-09-20 NOTE — Telephone Encounter (Signed)
No show, called and left message concerning missed apt. today.  Addressed multiple no shows/cancellation and asked pt if she wishes to continue with therapy or to be DC'd,.  Included next apt date and time iwth contact info given.    [address], LPTA; CBIS [phone]

## 2018-09-22 ENCOUNTER — Encounter (HOSPITAL_COMMUNITY): Payer: Self-pay

## 2018-09-22 ENCOUNTER — Ambulatory Visit (HOSPITAL_COMMUNITY): Payer: Self-pay

## 2018-09-22 ENCOUNTER — Telehealth (HOSPITAL_COMMUNITY): Payer: Self-pay

## 2018-09-22 NOTE — Therapy (Signed)
[place], [place], [address] Phone: [phone]   Fax:  [phone]  Patient Details  Name: Amanda Morales MRN: [id number] Date of Birth: [birth year] Referring Provider:  No ref. provider found  Encounter Date: 09/22/2018  Pt non-compliant with attendance policy so is being discharged at this time.  PHYSICAL THERAPY DISCHARGE SUMMARY  Visits from Start of Care: 3  Current functional level related to goals / functional outcomes: See last note   Remaining deficits: See last note   Education / Equipment: n/a  Plan: Patient agrees to discharge.  Patient goals were not met. Patient is being discharged due to not returning since the last visit.  ?????      Geraldine Solar PT, [place] [address] [place], [place], [address] Phone: [phone]   Fax:  [phone]

## 2018-09-22 NOTE — Telephone Encounter (Signed)
No show; called and left message re no show. Educated pt that she will be d/c at this time due to noon-compliance with [facility]'s attendance policy and that if she wanted to return she would have to do so with a new referral from her physician.   Jac Canavan PT, DPT

## 2018-09-27 ENCOUNTER — Encounter (HOSPITAL_COMMUNITY): Payer: Self-pay

## 2018-09-29 ENCOUNTER — Encounter (HOSPITAL_COMMUNITY): Payer: Self-pay

## 2020-08-27 ENCOUNTER — Other Ambulatory Visit: Payer: Self-pay

## 2021-05-22 ENCOUNTER — Other Ambulatory Visit: Payer: Self-pay

## 2021-05-22 ENCOUNTER — Ambulatory Visit
Admission: EM | Admit: 2021-05-22 | Discharge: 2021-05-22 | Disposition: A | Discharge status: Home / Self Care | Payer: Medicaid Other | Attending: Emergency Medicine | Admitting: Emergency Medicine

## 2021-05-22 DIAGNOSIS — F411 Generalized anxiety disorder: Secondary | ICD-10-CM

## 2021-05-22 DIAGNOSIS — R0789 Other chest pain: Secondary | ICD-10-CM

## 2021-05-22 DIAGNOSIS — R0981 Nasal congestion: Secondary | ICD-10-CM

## 2021-05-22 MED ORDER — PREDNISONE 20 MG PO TABS: 20.0000 mg | ORAL_TABLET | Freq: Two times a day (BID) | ORAL | 0 refills | Status: AC

## 2021-05-22 MED ORDER — DEXAMETHASONE SODIUM PHOSPHATE 10 MG/ML IJ SOLN
10.0000 mg | Freq: Once | INTRAMUSCULAR | Status: AC
  Administered 2021-05-22: 10 mg via INTRAMUSCULAR

## 2021-05-22 MED ORDER — HYDROXYZINE HCL 25 MG PO TABS: 25.0000 mg | ORAL_TABLET | Freq: Four times a day (QID) | ORAL | 0 refills | Status: DC

## 2021-05-22 MED ORDER — ALBUTEROL SULFATE HFA 108 (90 BASE) MCG/ACT IN AERS
2.0000 | INHALATION_SPRAY | Freq: Once | RESPIRATORY_TRACT | Status: AC
  Administered 2021-05-22: 2 via RESPIRATORY_TRACT

## 2021-05-22 NOTE — ED Provider Notes (Signed)
[facility] CARE CENTER   [id number] 05/22/21 Arrival Time: 1416   CC: CHEST PAIN  SUBJECTIVE:  Amanda Morales is a 35 y.o. female who presents with complaint of chest tightness and pressure x 3 days ago.  Denies a precipitating event, trauma, recent lower respiratory tract, or strenuous upper body activities.  Generalized.  Describes as stable, that is persistent and tight in character.   Has tried OTC medications without relief.  Symptoms made worse with coughing.  Denies radiating symptoms.  Denies previous symptoms in the past.  Complains of lightheadedness, palpitations, tachycardia, dyspnea, cold sweats, and anxiety.  Denies fever, chills, SOB, nausea, vomiting, abdominal pain, changes in bowel or bladder habits, diaphoresis, numbness/tingling in extremities, peripheral edema, or anxiety.    Admits to smoking tobacco.  1/2 PPD x 20 years.    Denies SOB, calf pain or swelling, recent long travel, recent surgery, pregnancy, malignancy, hormone use, or previous blood clot  Denies close relatives with cardiac hx  Previous cardiac testing: none.  ROS: As per HPI.  All other pertinent ROS negative.    Past Medical History:  Diagnosis Date   Anxiety    Asthma    as a child   Depression    GERD (gastroesophageal reflux disease)    Past Surgical History:  Procedure Laterality Date   BACK SURGERY     LUMBAR LAMINECTOMY/DECOMPRESSION MICRODISCECTOMY N/A 08/04/2018   Procedure: Microlumbar decompression Lumbar three-four, Revision Lumbar four-five;  Surgeon: Jene Every, MD;  Location: [facility] OR;  Service: Orthopedics;  Laterality: N/A;   TONSILLECTOMY     WISDOM TOOTH EXTRACTION     Allergies  Allergen Reactions   Penicillins Hives, Rash and Other (See Comments)    Has patient had a PCN reaction causing immediate rash, facial/tongue/throat swelling, SOB or lightheadedness with hypotension: unkn Has patient had a PCN reaction causing severe rash involving mucus membranes or skin  necrosis: unkn PATIENT HAS HAD A PCN REACTION THAT REQUIRED HOSPITALIZATION:  #  #  YES  #  #  Has patient had a PCN reaction occurring within the last 10 years: #  #  #  YES  #  #  #  If all of the above answers are "NO", then may proceed with Cephalosporin use.    No current facility-administered medications on file prior to encounter.   Current Outpatient Medications on File Prior to Encounter  Medication Sig Dispense Refill   docusate sodium (COLACE) 100 MG capsule Take 1 capsule (100 mg total) by mouth 2 (two) times daily. 40 capsule 1   methocarbamol (ROBAXIN) 500 MG tablet Take 1 tablet (500 mg total) by mouth every 6 (six) hours as needed for muscle spasms. 40 tablet 1   oxyCODONE 10 MG TABS Take 1 tablet (10 mg total) by mouth every 3 (three) hours as needed for severe pain ((score 7 to 10)). 40 tablet 0   polyethylene glycol (MIRALAX) packet Take 17 g by mouth daily. 14 each 0   Social History   Socioeconomic History   Marital status: Single    Spouse name: Not on file   Number of children: Not on file   Years of education: Not on file   Highest education level: Not on file  Occupational History   Not on file  Tobacco Use   Smoking status: Every Day    Packs/day: 0.50    Years: 15.00    Pack years: 7.50    Types: Cigarettes   Smokeless tobacco: Never  Vaping  Use   Vaping Use: Not on file  Substance and Sexual Activity   Alcohol use: Yes    Comment: occ   Drug use: Yes    Types: Marijuana    Comment: 2 joints a week   Sexual activity: Not on file  Other Topics Concern   Not on file  Social History Narrative   Not on file   Social Determinants of Health   Financial Resource Strain: Not on file  Food Insecurity: Not on file  Transportation Needs: Not on file  Physical Activity: Not on file  Stress: Not on file  Social Connections: Not on file  Intimate Partner Violence: Not on file   History reviewed. No pertinent family  history.   OBJECTIVE:  Vitals:   05/22/21 1422  BP: (!) 162/98  Pulse: 92  Resp: 20  Temp: 98.5 F (36.9 C)  SpO2: 97%    General appearance: alert; no distress Eyes: PERRLA; EOMI; conjunctiva normal HENT: normocephalic; atraumatic; nares patent; oropharynx clear Neck: supple; no carotid bruits Lungs: clear to auscultation bilaterally without adventitious breath sounds Heart: regular rate and rhythm.  Clear S1 and S2 without rubs, gallops, or murmur. Abdomen: soft, non-tender; bowel sounds normal; no guarding Extremities: no cyanosis or edema; symmetrical with no gross deformities Skin: warm and dry Psychological: alert and cooperative; normal mood and affect  ECG: Orders placed or performed during the hospital encounter of 05/22/21   ED EKG   ED EKG   EKG normal sinus rhythm without ST elevations, depressions, or prolonged PR interval.  No narrowing or widening of the QRS complexes.  Nonspecific T wave abnormality per EKG, but no past EKG for comparison.  Suspicious for artifact.     ASSESSMENT & PLAN:  1. Chest tightness   2. Sinus congestion   3. Anxiety state     Meds ordered this encounter  Medications   hydrOXYzine (ATARAX/VISTARIL) 25 MG tablet    Sig: Take 1 tablet (25 mg total) by mouth every 6 (six) hours.    Dispense:  12 tablet    Refill:  0    Order Specific Question:   Supervising Provider    Answer:   Eustace Moore [[id number]]   predniSONE (DELTASONE) 20 MG tablet    Sig: Take 1 tablet (20 mg total) by mouth 2 (two) times daily with a meal for 5 days.    Dispense:  10 tablet    Refill:  0    Order Specific Question:   Supervising Provider    Answer:   Eustace Moore [[id number]]   dexamethasone (DECADRON) injection 10 mg   albuterol (VENTOLIN HFA) 108 (90 Base) MCG/ACT inhaler 2 puff   Unable to rule out cardiac disease or blood clot in urgent care setting.  Offered patient further evaluation and management in the ED.  Patient declines at  this time and would like to try outpatient therapy first.  Aware of the risk associated with this decision including missed diagnosis, organ damage, organ failure, and/or death.  Patient aware and in agreement.     We will try steroid shot in office for congestion As well as albuterol inhaler and prednisone prescription  Rest and drink fluids Eat a well-balanced diet, and avoid excessive caffeine intake Hydroxyzine nightly for symptom relief Some things you may try doing to help alleviate your symptoms include: keeping a journal, exercise, talking to a friend or relative, listening to music, going for a walk or hike outside, or other activities that  you may find enjoyable PCP assistance initiated Recommending further evaluation and management with PCP Return or go to ER if you have any new or worsening symptoms such as fever, chills, fatigue, worsening shortness of breath, wheezing, chest pain, nausea, vomiting, abdominal pain, changes in bowel or bladder habits, etc...   Chest pain precautions given. Reviewed expectations re: course of current medical issues. Questions answered. Outlined signs and symptoms indicating need for more acute intervention. Patient verbalized understanding. After Visit Summary given.   Rennis Harding, PA-C 05/22/21 1526

## 2021-05-22 NOTE — Discharge Instructions (Addendum)
Unable to rule out cardiac disease or blood clot in urgent care setting.  Offered patient further evaluation and management in the ED.  Patient declines at this time and would like to try outpatient therapy first.  Aware of the risk associated with this decision including missed diagnosis, organ damage, organ failure, and/or death.  Patient aware and in agreement.     We will try steroid shot in office for congestion As well as albuterol inhaler and prednisone prescription  Rest and drink fluids Eat a well-balanced diet, and avoid excessive caffeine intake Hydroxyzine nightly for symptom relief Some things you may try doing to help alleviate your symptoms include: keeping a journal, exercise, talking to a friend or relative, listening to music, going for a walk or hike outside, or other activities that you may find enjoyable PCP assistance initiated Recommending further evaluation and management with PCP Return or go to ER if you have any new or worsening symptoms such as fever, chills, fatigue, worsening shortness of breath, wheezing, chest pain, nausea, vomiting, abdominal pain, changes in bowel or bladder habits, etc..Marland Kitchen

## 2021-05-22 NOTE — ED Triage Notes (Signed)
Pt presents c/o chest tightness that came on suddenly and pt feels unwell, pt has had cold symptoms past couple days

## 2021-09-11 ENCOUNTER — Emergency Department (HOSPITAL_COMMUNITY)
Admission: EM | Admit: 2021-09-11 | Discharge: 2021-09-11 | Disposition: A | Discharge status: Home / Self Care | Payer: Self-pay | Attending: Emergency Medicine | Admitting: Emergency Medicine

## 2021-09-11 ENCOUNTER — Other Ambulatory Visit: Payer: Self-pay

## 2021-09-11 ENCOUNTER — Emergency Department (HOSPITAL_COMMUNITY): Payer: Self-pay

## 2021-09-11 ENCOUNTER — Encounter (HOSPITAL_COMMUNITY): Payer: Self-pay | Admitting: Emergency Medicine

## 2021-09-11 DIAGNOSIS — R519 Headache, unspecified: Secondary | ICD-10-CM

## 2021-09-11 DIAGNOSIS — F17210 Nicotine dependence, cigarettes, uncomplicated: Secondary | ICD-10-CM | POA: Insufficient documentation

## 2021-09-11 DIAGNOSIS — I10 Essential (primary) hypertension: Secondary | ICD-10-CM | POA: Insufficient documentation

## 2021-09-11 DIAGNOSIS — R202 Paresthesia of skin: Secondary | ICD-10-CM | POA: Insufficient documentation

## 2021-09-11 DIAGNOSIS — J45909 Unspecified asthma, uncomplicated: Secondary | ICD-10-CM | POA: Insufficient documentation

## 2021-09-11 LAB — BASIC METABOLIC PANEL
Anion gap: 11 (ref 5–15)
BUN: 10 mg/dL (ref 6–20)
CO2: 26 mmol/L (ref 22–32)
Calcium: 9.4 mg/dL (ref 8.9–10.3)
Chloride: 98 mmol/L (ref 98–111)
Creatinine, Ser: 0.76 mg/dL (ref 0.44–1.00)
GFR, Estimated: >60 mL/min (ref >60)
Glucose, Bld: 101 mg/dL — ABNORMAL HIGH (ref 70–99)
Potassium: 3.5 mmol/L (ref 3.5–5.1)
Sodium: 135 mmol/L (ref 135–145)

## 2021-09-11 LAB — CBC WITH DIFFERENTIAL/PLATELET
Abs Immature Granulocytes: 0.08 10*3/uL — ABNORMAL HIGH (ref 0.00–0.07)
Basophils Absolute: 0.1 10*3/uL (ref 0.0–0.1)
Basophils Relative: 0 %
Eosinophils Absolute: 0 10*3/uL (ref 0.0–0.5)
Eosinophils Relative: 0 %
HCT: 44.6 % (ref 36.0–46.0)
Hemoglobin: 15.5 g/dL — ABNORMAL HIGH (ref 12.0–15.0)
Immature Granulocytes: 1 %
Lymphocytes Relative: 18 %
Lymphs Abs: 2.4 10*3/uL (ref 0.7–4.0)
MCH: 33.2 pg (ref 26.0–34.0)
MCHC: 34.8 g/dL (ref 30.0–36.0)
MCV: 95.5 fL (ref 80.0–100.0)
Monocytes Absolute: 0.8 10*3/uL (ref 0.1–1.0)
Monocytes Relative: 5 %
Neutro Abs: 10.5 10*3/uL — ABNORMAL HIGH (ref 1.7–7.7)
Neutrophils Relative %: 76 %
Platelets: 246 10*3/uL (ref 150–400)
RBC: 4.67 MIL/uL (ref 3.87–5.11)
RDW: 12.3 % (ref 11.5–15.5)
WBC: 13.9 10*3/uL — ABNORMAL HIGH (ref 4.0–10.5)
nRBC: 0 % (ref 0.0–0.2)

## 2021-09-11 LAB — TROPONIN I (HIGH SENSITIVITY): Troponin I (High Sensitivity): <2 ng/L (ref <18)

## 2021-09-11 MED ORDER — HYDROXYZINE HCL 25 MG PO TABS: 25.0000 mg | ORAL_TABLET | Freq: Four times a day (QID) | ORAL | 0 refills | Status: AC | PRN

## 2021-09-11 MED ORDER — LORAZEPAM 2 MG/ML IJ SOLN
0.5000 mg | Freq: Once | INTRAMUSCULAR | Status: AC
  Administered 2021-09-11: 0.5 mg via INTRAVENOUS
  Filled 2021-09-11: qty 1

## 2021-09-11 MED ORDER — HYDROXYZINE HCL 25 MG PO TABS
25.0000 mg | ORAL_TABLET | Freq: Once | ORAL | Status: AC
  Administered 2021-09-11: 25 mg via ORAL
  Filled 2021-09-11: qty 1

## 2021-09-11 NOTE — ED Notes (Signed)
Patient transported to CT 

## 2021-09-11 NOTE — ED Triage Notes (Signed)
Pt c/o hypertension and bilateral arm numbness that started this morning.

## 2021-09-11 NOTE — Discharge Instructions (Addendum)
Lab work and imaging are reassuring.  Given you a prescription for Vistaril please take as needed for anxiety.  Please follow-up your PCP for further evaluation.  Come back to the emergency department if you develop chest pain, shortness of breath, severe abdominal pain, uncontrolled nausea, vomiting, diarrhea.

## 2021-09-11 NOTE — ED Provider Notes (Signed)
[facility] EMERGENCY DEPARTMENT Provider Note   CSN: [id number] Arrival date & time: 09/11/21  1720     History Chief Complaint  Patient presents with   Hypertension    Amanda Morales is a 35 y.o. female.  HPI  Patient with significant medical history of anxiety, asthma, depression, GERD presents to the emergency department with chief complaint of high blood pressure and paresthesias in her upper extremities.  Patient states symptoms started this afternoon around 1 PM, she states that she started to feel unwell and had  numbness like sensation in her upper extremities, she then decided to take her blood pressure noted that her systolic pressures in the 200s.  She endorses that she has a slight headache, states its around her temples, no associated change in vision, weakness in the upper and or lower extremities, she denies any paresthesia lower extremities.  She denies  recent head trauma, is not on anticoagulant.  She denies  neck or back pain, denies chest pain or shortness of breath, worsening pedal edema, denies  history of IV drug use.  Patient states that she has a history of anxiety but states this feels slightly different.  She is currently not medicated for anxiety.  She states that she had some slight chest soreness in the middle of her chest that started at 1 PM but this is since resolved, has no cardiac history, no history PEs or DVTs, not on hormone therapy.  She has no complaints at this time.  Past Medical History:  Diagnosis Date   Anxiety    Asthma    as a child   Depression    GERD (gastroesophageal reflux disease)     Patient Active Problem List   Diagnosis Date Noted   HNP (herniated nucleus pulposus), lumbar 08/04/2018   Spinal stenosis at L4-L5 level 08/04/2018    Past Surgical History:  Procedure Laterality Date   BACK SURGERY     LUMBAR LAMINECTOMY/DECOMPRESSION MICRODISCECTOMY N/A 08/04/2018   Procedure: Microlumbar decompression Lumbar  three-four, Revision Lumbar four-five;  Surgeon: Jene Every, MD;  Location: [facility] OR;  Service: Orthopedics;  Laterality: N/A;   TONSILLECTOMY     WISDOM TOOTH EXTRACTION       OB History   No obstetric history on file.     History reviewed. No pertinent family history.  Social History   Tobacco Use   Smoking status: Every Day    Packs/day: 0.50    Years: 15.00    Pack years: 7.50    Types: Cigarettes   Smokeless tobacco: Never  Substance Use Topics   Alcohol use: Yes    Comment: occ   Drug use: Yes    Types: Marijuana    Comment: 2 joints a week    Home Medications Prior to Admission medications   Medication Sig Start Date End Date Taking? Authorizing Provider  albuterol (VENTOLIN HFA) 108 (90 Base) MCG/ACT inhaler Inhale 1 puff into the lungs every 6 (six) hours as needed for wheezing or shortness of breath.   Yes [provider]  diazepam (VALIUM) 10 MG tablet Take 10 mg by mouth every 6 (six) hours as needed for anxiety.   Yes [provider]  hydrOXYzine (ATARAX/VISTARIL) 25 MG tablet Take 1 tablet (25 mg total) by mouth every 6 (six) hours as needed for anxiety. 09/11/21 10/11/21 Yes Carroll Sage, PA-C  docusate sodium (COLACE) 100 MG capsule Take 1 capsule (100 mg total) by mouth 2 (two) times daily. Patient not  taking: Reported on 09/11/2021 08/04/18   Dorothy Spark, PA-C  methocarbamol (ROBAXIN) 500 MG tablet Take 1 tablet (500 mg total) by mouth every 6 (six) hours as needed for muscle spasms. Patient not taking: Reported on 09/11/2021 08/04/18   Dorothy Spark, PA-C  oxyCODONE 10 MG TABS Take 1 tablet (10 mg total) by mouth every 3 (three) hours as needed for severe pain ((score 7 to 10)). Patient not taking: Reported on 09/11/2021 08/05/18   Dorothy Spark, PA-C  polyethylene glycol [facility]) packet Take 17 g by mouth daily. Patient not taking: Reported on 09/11/2021 08/04/18   Dorothy Spark, PA-C    Allergies     Penicillins  Review of Systems   Review of Systems  Constitutional:  Negative for chills and fever.  HENT:  Negative for congestion.   Respiratory:  Negative for shortness of breath.   Cardiovascular:  Negative for chest pain.  Gastrointestinal:  Negative for abdominal pain.  Genitourinary:  Negative for enuresis.  Musculoskeletal:  Negative for back pain.  Skin:  Negative for rash.  Neurological:  Positive for numbness and headaches. Negative for dizziness and weakness.  Hematological:  Does not bruise/bleed easily.   Physical Exam Updated Vital Signs BP (!) 149/91   Pulse 82   Temp 98.1 F (36.7 C) (Oral)   Resp (!) 25   Ht 5\' 3"  (1.6 m)   Wt 92.6 kg   LMP 08/20/2021 (Approximate)   SpO2 98%   BMI 36.15 kg/m   Physical Exam Vitals and nursing note reviewed.  Constitutional:      General: She is not in acute distress.    Appearance: She is not ill-appearing.  HENT:     Head: Normocephalic and atraumatic.     Nose: No congestion.  Eyes:     Extraocular Movements: Extraocular movements intact.     Conjunctiva/sclera: Conjunctivae normal.     Pupils: Pupils are equal, round, and reactive to light.  Cardiovascular:     Rate and Rhythm: Normal rate and regular rhythm.     Pulses: Normal pulses.     Heart sounds: No murmur heard.   No friction rub. No gallop.  Pulmonary:     Effort: No respiratory distress.     Breath sounds: No wheezing, rhonchi or rales.  Abdominal:     Palpations: Abdomen is soft.     Tenderness: There is no abdominal tenderness. There is no right CVA tenderness or left CVA tenderness.  Musculoskeletal:     Cervical back: Neck supple.     Right lower leg: No edema.     Left lower leg: No edema.     Comments: Patient has full range of motion, 5 of 5 strength the upper lower extremities, back is palpated nontender to palpation.  Skin:    General: Skin is warm and dry.  Neurological:     Mental Status: She is alert.     GCS: GCS eye subscore  is 4. GCS verbal subscore is 5. GCS motor subscore is 6.     Sensory: Sensation is intact.     Motor: No weakness.     Coordination: Romberg sign negative. Finger-Nose-Finger Test normal.     Comments: Cranial nerves II through XII are grossly intact, no difficulty word finding, able to follow two-step commands, no unilateral weakness present.  Sensation fully intact.  Psychiatric:        Mood and Affect: Mood normal.    ED Results / Procedures / Treatments  Labs (all labs ordered are listed, but only abnormal results are displayed) Labs Reviewed  CBC WITH DIFFERENTIAL/PLATELET - Abnormal; Notable for the following components:      Result Value   WBC 13.9 (*)    Hemoglobin 15.5 (*)    Neutro Abs 10.5 (*)    Abs Immature Granulocytes 0.08 (*)    All other components within normal limits  BASIC METABOLIC PANEL - Abnormal; Notable for the following components:   Glucose, Bld 101 (*)    All other components within normal limits  TROPONIN I (HIGH SENSITIVITY)    EKG EKG Interpretation  Date/Time:  Thursday September 11 2021 21:46:35 EDT Ventricular Rate:  80 PR Interval:  142 QRS Duration: 100 QT Interval:  413 QTC Calculation: 477 R Axis:   67 Text Interpretation: Sinus rhythm no acute ST/T changes No old tracing to compare Confirmed by Pricilla Loveless [phone]) on 09/11/2021 11:27:13 PM  Radiology CT Head Wo Contrast  Result Date: 09/11/2021 CLINICAL DATA:  Chronic headaches hand bilateral arm numbness EXAM: CT HEAD WITHOUT CONTRAST TECHNIQUE: Contiguous axial images were obtained from the base of the skull through the vertex without intravenous contrast. COMPARISON:  None. FINDINGS: Brain: No evidence of acute infarction, hemorrhage, hydrocephalus, extra-axial collection or mass lesion/mass effect. Vascular: No hyperdense vessel or unexpected calcification. Skull: Normal. Negative for fracture or focal lesion. Sinuses/Orbits: Orbits and their contents are within normal limits.  Mucosal retention cyst is noted within the right maxillary antrum. 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 IMPRESSION: No acute intracranial abnormality noted. Right maxillary mucosal retention cyst. 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 Electronically Signed   By: Alcide Clever M.D.   On: 09/11/2021 23:18   DG Chest Portable 1 View  Result Date: 09/11/2021 CLINICAL DATA:  Chest pain. EXAM: PORTABLE CHEST 1 VIEW COMPARISON:  None. FINDINGS: The heart size and mediastinal contours are within normal limits. Both lungs are clear. The visualized skeletal structures are unremarkable. IMPRESSION: No active disease. Electronically Signed   By: Darliss Cheney M.D.   On: 09/11/2021 22:05    Procedures Procedures   Medications Ordered in ED Medications  hydrOXYzine (ATARAX/VISTARIL) tablet 25 mg (has no administration in time range)  LORazepam (ATIVAN) injection 0.5 mg (0.5 mg Intravenous Given 09/11/21 2209)    ED Course  I have reviewed the triage vital signs and the nursing notes.  Pertinent labs & imaging results that were available during my care of the patient were reviewed by me and considered in my medical decision making (see chart for details).    MDM Rules/Calculators/A&P                          Initial impression-patient presents with headaches and paresthesias.  She is alert, does not appear in acute stress, vital signs are reassuring.  Unclear etiology will obtain CT head, basic lab work-up, and reassess.  Work-up-CBC shows slight leukocytosis of 13.8, BMP unremarkable, for troponin is less than 2, chest x-ray unremarkable, CT head unremarkable EKG sinus without signs of ischemia.  Reassessment-patient was reassessed, states that the paresthesias in her arms have gone away, has no headache at this time.  States she is feels feels slightly anxious.  Patient is reassessed has no complaints, patient agreeable for  discharge.  Rule out- I have low suspicion for ACS as history is atypical, patient has no cardiac history, EKG was sinus  rhythm without signs of ischemia, first troponin is less than 2, will defer second troponin as patient been having chest pain for greater than 12 hours would expect elevation if ACS was present.  Low suspicion for PE as patient denies pleuritic chest pain, shortness of breath, patient denies leg pain, no pedal edema noted on exam, patient was PERC negative.  Low suspicion for AAA or aortic dissection as history is atypical, patient has low risk factors.  Low suspicion for systemic infection as patient is nontoxic-appearing, vital signs reassuring, no obvious source infection noted on exam.  Patient does have slight leukocytosis likely this is acute phase reaction versus hemoconcentrated as he was also elevated to very low suspicion for infectious etiology.  I have low suspicion for intracranial head bleed and or mass as CT imaging negative for acute findings.  I have low suspicion for CVA as she has no focal deficits present on exam.  Low suspicion for AAA or dissection of the carotid or vertebral arteries as presentation is atypical.  Low suspicion for hypertensive urgency or emergency is no signs of organ damage present my exam, blood pressure is within normal limits.  Will defer antihypertensive treatment at this time however follow-up with PCP for further evaluation.    Plan-  Headaches and paresthesias improved-unclear etiology, different diagnosis includes anxiety, migraines, tension-like headaches, recommend follow-up with PCP  for further evaluation.  Vital signs have remained stable, no indication for hospital admission.   Patient given at home care as well strict return precautions.  Patient verbalized that they understood agreed to said plan.  Final Clinical Impression(s) / ED Diagnoses Final diagnoses:  Paresthesias  Nonintractable headache, unspecified chronicity  pattern, unspecified headache type    Rx / DC Orders ED Discharge Orders          Ordered    hydrOXYzine (ATARAX/VISTARIL) 25 MG tablet  Every 6 hours PRN        09/11/21 2331             Carroll Sage, PA-C 09/11/21 8144    Pricilla Loveless, MD 09/16/21 [phone]

## 2021-09-11 NOTE — ED Notes (Signed)
Re-checked b/p - 152/99

## 2021-12-16 ENCOUNTER — Emergency Department (HOSPITAL_COMMUNITY)
Admission: EM | Admit: 2021-12-16 | Discharge: 2021-12-16 | Disposition: A | Discharge status: Home / Self Care | Payer: Medicaid Other | Attending: Student | Admitting: Student

## 2021-12-16 ENCOUNTER — Encounter (HOSPITAL_COMMUNITY): Payer: Self-pay

## 2021-12-16 ENCOUNTER — Emergency Department (HOSPITAL_COMMUNITY): Payer: Medicaid Other

## 2021-12-16 ENCOUNTER — Other Ambulatory Visit: Payer: Self-pay

## 2021-12-16 DIAGNOSIS — R202 Paresthesia of skin: Secondary | ICD-10-CM | POA: Insufficient documentation

## 2021-12-16 DIAGNOSIS — F17210 Nicotine dependence, cigarettes, uncomplicated: Secondary | ICD-10-CM | POA: Insufficient documentation

## 2021-12-16 DIAGNOSIS — J45909 Unspecified asthma, uncomplicated: Secondary | ICD-10-CM | POA: Insufficient documentation

## 2021-12-16 DIAGNOSIS — R0789 Other chest pain: Secondary | ICD-10-CM | POA: Insufficient documentation

## 2021-12-16 LAB — BASIC METABOLIC PANEL
Anion gap: 8 (ref 5–15)
BUN: 13 mg/dL (ref 6–20)
CO2: 26 mmol/L (ref 22–32)
Calcium: 8.8 mg/dL — ABNORMAL LOW (ref 8.9–10.3)
Chloride: 104 mmol/L (ref 98–111)
Creatinine, Ser: 0.63 mg/dL (ref 0.44–1.00)
GFR, Estimated: >60 mL/min (ref >60)
Glucose, Bld: 122 mg/dL — ABNORMAL HIGH (ref 70–99)
Potassium: 4.1 mmol/L (ref 3.5–5.1)
Sodium: 138 mmol/L (ref 135–145)

## 2021-12-16 LAB — CBC
HCT: 43.8 % (ref 36.0–46.0)
Hemoglobin: 14.7 g/dL (ref 12.0–15.0)
MCH: 32 pg (ref 26.0–34.0)
MCHC: 33.6 g/dL (ref 30.0–36.0)
MCV: 95.2 fL (ref 80.0–100.0)
Platelets: 222 10*3/uL (ref 150–400)
RBC: 4.6 MIL/uL (ref 3.87–5.11)
RDW: 12 % (ref 11.5–15.5)
WBC: 10.6 10*3/uL — ABNORMAL HIGH (ref 4.0–10.5)
nRBC: 0 % (ref 0.0–0.2)

## 2021-12-16 LAB — D-DIMER, QUANTITATIVE: D-Dimer, Quant: <0.27 ug/mL-FEU (ref 0.00–0.50)

## 2021-12-16 LAB — TROPONIN I (HIGH SENSITIVITY): Troponin I (High Sensitivity): 6 ng/L (ref <18)

## 2021-12-16 MED ORDER — LIDOCAINE 5 % EX PTCH
1.0000 | MEDICATED_PATCH | CUTANEOUS | Status: DC
  Administered 2021-12-16: 1 via TRANSDERMAL
  Filled 2021-12-16: qty 1

## 2021-12-16 MED ORDER — KETOROLAC TROMETHAMINE 15 MG/ML IJ SOLN
15.0000 mg | Freq: Once | INTRAMUSCULAR | Status: AC
  Administered 2021-12-16: 15 mg via INTRAVENOUS
  Filled 2021-12-16: qty 1

## 2021-12-16 NOTE — ED Triage Notes (Signed)
Patient woke this morning around 4am feeling tingling all over then began having left sided chest pain that feels sore.

## 2021-12-16 NOTE — ED Provider Notes (Signed)
[facility] EMERGENCY DEPARTMENT Provider Note  CSN: [id number] Arrival date & time: 12/16/21 0745  Chief Complaint(s) Chest Pain  HPI Amanda Morales is a 36 y.o. female with PMH anxiety, asthma who presents emergency department for evaluation of chest pain.  Patient states that she awoke this morning with full body tingling and chest tightness.  She then took an Atarax and states that the symptoms resolved but she has a persistent pain on the lateral chest wall on the left.  She states that the pain is worse with twisting and taking deep breaths.  She denies associated nausea, vomiting, exertional symptoms, diaphoresis or other systemic symptoms.   Chest Pain  Past Medical History Past Medical History:  Diagnosis Date   Anxiety    Asthma    as a child   Depression    GERD (gastroesophageal reflux disease)    Patient Active Problem List   Diagnosis Date Noted   HNP (herniated nucleus pulposus), lumbar 08/04/2018   Spinal stenosis at L4-L5 level 08/04/2018   Home Medication(s) Prior to Admission medications   Medication Sig Start Date End Date Taking? Authorizing Provider  albuterol (VENTOLIN HFA) 108 (90 Base) MCG/ACT inhaler Inhale 1 puff into the lungs every 6 (six) hours as needed for wheezing or shortness of breath.   Yes [provider]  docusate sodium (COLACE) 100 MG capsule Take 1 capsule (100 mg total) by mouth 2 (two) times daily. Patient not taking: Reported on 09/11/2021 08/04/18   Cecilie Kicks, PA-C  methocarbamol (ROBAXIN) 500 MG tablet Take 1 tablet (500 mg total) by mouth every 6 (six) hours as needed for muscle spasms. Patient not taking: Reported on 09/11/2021 08/04/18   Cecilie Kicks, PA-C  oxyCODONE 10 MG TABS Take 1 tablet (10 mg total) by mouth every 3 (three) hours as needed for severe pain ((score 7 to 10)). Patient not taking: Reported on 09/11/2021 08/05/18   Cecilie Kicks, PA-C  polyethylene glycol [facility]) packet Take 17 g by mouth  daily. Patient not taking: Reported on 09/11/2021 08/04/18   Cyndee Brightly                                                                                                                                    Past Surgical History Past Surgical History:  Procedure Laterality Date   BACK SURGERY     LUMBAR LAMINECTOMY/DECOMPRESSION MICRODISCECTOMY N/A 08/04/2018   Procedure: Microlumbar decompression Lumbar three-four, Revision Lumbar four-five;  Surgeon: Susa Day, MD;  Location: [place];  Service: Orthopedics;  Laterality: N/A;   TONSILLECTOMY     WISDOM TOOTH EXTRACTION     Family History History reviewed. No pertinent family history.  Social History Social History   Tobacco Use   Smoking status: Every Day    Packs/day: 0.50    Years: 15.00    Pack years: 7.50    Types: Cigarettes   Smokeless  tobacco: Never  Substance Use Topics   Alcohol use: Yes    Comment: occ   Drug use: Yes    Types: Marijuana    Comment: 2 joints a week   Allergies Penicillins  Review of Systems Review of Systems  Respiratory:  Positive for chest tightness.   Cardiovascular:  Positive for chest pain.   Physical Exam Vital Signs  I have reviewed the triage vital signs BP 121/78    Pulse 67    Temp 98.4 F (36.9 C) (Oral)    Resp (!) 21    Ht 5\' 2"  (1.575 m)    Wt 87.1 kg    LMP 12/04/2021 (Approximate)    SpO2 100%    BMI 35.12 kg/m   Physical Exam Vitals and nursing note reviewed.  Constitutional:      General: She is not in acute distress.    Appearance: She is well-developed.  HENT:     Head: Normocephalic and atraumatic.  Eyes:     Conjunctiva/sclera: Conjunctivae normal.  Cardiovascular:     Rate and Rhythm: Normal rate and regular rhythm.     Heart sounds: No murmur heard. Pulmonary:     Effort: Pulmonary effort is normal. No respiratory distress.     Breath sounds: Normal breath sounds.  Chest:     Chest wall: Tenderness (Intercostal muscles below the left breast)  present.  Abdominal:     Palpations: Abdomen is soft.     Tenderness: There is no abdominal tenderness.  Musculoskeletal:        General: No swelling.     Cervical back: Neck supple.  Skin:    General: Skin is warm and dry.     Capillary Refill: Capillary refill takes less than 2 seconds.  Neurological:     Mental Status: She is alert.  Psychiatric:        Mood and Affect: Mood normal.    ED Results and Treatments Labs (all labs ordered are listed, but only abnormal results are displayed) Labs Reviewed  BASIC METABOLIC PANEL - Abnormal; Notable for the following components:      Result Value   Glucose, Bld 122 (*)    Calcium 8.8 (*)    All other components within normal limits  CBC - Abnormal; Notable for the following components:   WBC 10.6 (*)    All other components within normal limits  D-DIMER, QUANTITATIVE  TROPONIN I (HIGH SENSITIVITY)  TROPONIN I (HIGH SENSITIVITY)                                                                                                                          Radiology DG Chest Port 1 View  Result Date: 12/16/2021 CLINICAL DATA:  Chest pain EXAM: PORTABLE CHEST 1 VIEW COMPARISON:  Chest x-ray dated September 11, 2021 FINDINGS: The heart size and mediastinal contours are within normal limits. Both lungs are clear. The visualized skeletal structures are unremarkable. IMPRESSION: No active disease. Electronically Signed  By: Yetta Glassman M.D.   On: 12/16/2021 08:17    Pertinent labs & imaging results that were available during my care of the patient were reviewed by me and considered in my medical decision making (see MDM for details).  Medications Ordered in ED Medications  lidocaine (LIDODERM) 5 % 1 patch (1 patch Transdermal Patch Applied 12/16/21 0837)  ketorolac (TORADOL) 15 MG/ML injection 15 mg (15 mg Intravenous Given 12/16/21 [id number])                                                                                                                                      Procedures Procedures  (including critical care time)  Medical Decision Making / ED Course   This patient presents to the ED for concern of chest pain, this involves an extensive number of treatment options, and is a complaint that carries with it a high risk of complications and morbidity.  The differential diagnosis includes ACS, PE, musculoskeletal strain, costochondritis, GERD, anxiety.  MDM: Patient seen emergency department for evaluation of chest pain.  Physical exam reveals tenderness over the intercostal muscles on the left breast.  Exam otherwise unremarkable.  Laboratory evaluation with a mild leukocytosis to 10.6 but is otherwise unremarkable.  High-sensitivity troponin negative.  ECG nonischemic.  D-dimer negative.  Chest x-ray unremarkable.  A lidocaine patch and Toradol were given to the patient which improved her symptoms.  Patient's heart score is 1 and I have low suspicion for ACS.  With negative D-dimer, low suspicion for PE.  Suspect atypical chest wall pain versus intercostal muscle strain at this time.  Patient then discharged with strict return precautions which she voiced understanding.   Additional history obtained: -Additional history obtained from patient's mother -External records from outside source obtained and reviewed including: Chart review including previous notes, labs, imaging, consultation notes   Lab Tests: -I ordered, reviewed, and interpreted labs.  The pertinent results include: Mild leukocytosis to 10.6, negative troponin, negative D-dimer   EKG  EKG Interpretation  Date/Time:  Tuesday December 16 2021 07:57:15 EST Ventricular Rate:  75 PR Interval:  151 QRS Duration: 87 QT Interval:  385 QTC Calculation: 430 R Axis:   79 Text Interpretation: Sinus rhythm Confirmed by Karinne Schmader ([id number]) on 12/16/2021 8:21:07 AM         Imaging Studies ordered: I ordered imaging studies including chest x-ray I  independently visualized and interpreted imaging. I agree with the radiologist interpretation   Medicines ordered and prescription drug management: Meds ordered this encounter  Medications   lidocaine (LIDODERM) 5 % 1 patch   ketorolac (TORADOL) 15 MG/ML injection 15 mg    -Reevaluation of the patient after these medicines showed that the patient improved -I have reviewed the patients home medicines and have made adjustments as needed  Critical interventions None    Cardiac Monitoring: The patient was maintained on a cardiac monitor.  I personally  viewed and interpreted the cardiac monitored which showed an underlying rhythm of: Normal sinus rhythm   Reevaluation: After the interventions noted above, I reevaluated the patient and found that they have :improved  Co morbidities that complicate the patient evaluation  Past Medical History:  Diagnosis Date   Anxiety    Asthma    as a child   Depression    GERD (gastroesophageal reflux disease)       Dispostion: Discharge     Final Clinical Impression(s) / ED Diagnoses Final diagnoses:  None     @PCDICTATION @    Teressa Lower, MD 12/16/21 1022

## 2022-02-25 ENCOUNTER — Other Ambulatory Visit: Payer: Self-pay

## 2022-02-25 ENCOUNTER — Ambulatory Visit (INDEPENDENT_AMBULATORY_CARE_PROVIDER_SITE_OTHER): Payer: Self-pay | Admitting: Nurse Practitioner

## 2022-02-25 ENCOUNTER — Encounter: Payer: Self-pay | Admitting: Nurse Practitioner

## 2022-02-25 VITALS — BP 130/78 | HR 81 | Ht 62.0 in | Wt 204.0 lb

## 2022-02-25 DIAGNOSIS — F411 Generalized anxiety disorder: Secondary | ICD-10-CM | POA: Insufficient documentation

## 2022-02-25 DIAGNOSIS — M5416 Radiculopathy, lumbar region: Secondary | ICD-10-CM | POA: Insufficient documentation

## 2022-02-25 DIAGNOSIS — F32A Depression, unspecified: Secondary | ICD-10-CM | POA: Insufficient documentation

## 2022-02-25 DIAGNOSIS — F419 Anxiety disorder, unspecified: Secondary | ICD-10-CM

## 2022-02-25 DIAGNOSIS — F17200 Nicotine dependence, unspecified, uncomplicated: Secondary | ICD-10-CM | POA: Insufficient documentation

## 2022-02-25 MED ORDER — FLUOXETINE HCL 10 MG PO CAPS: 10.0000 mg | ORAL_CAPSULE | Freq: Every day | ORAL | 3 refills | Status: DC

## 2022-02-25 MED ORDER — HYDROXYZINE PAMOATE 25 MG PO CAPS: 25.0000 mg | ORAL_CAPSULE | Freq: Three times a day (TID) | ORAL | 0 refills | Status: DC | PRN

## 2022-02-25 NOTE — Assessment & Plan Note (Addendum)
She has been smoking half a pack for 15 years ?Now she smokes 6-7 cigarettes daily.  ?Need to quit smoking including the risk of lung cancer COPD discussed with patient ?Not ready to quit, patient states that she will work on cutting back on smoking ?Smoking cessation education materials given today. ? ?

## 2022-02-25 NOTE — Patient Instructions (Addendum)
Please take prozac 10mg  daily for your anxiety ?Take hydroxyzine 25mg  every 8 hours as needed for anxiety  ? ? ? ?It is important that you exercise regularly at least 30 minutes 5 times a week.  ?Think about what you will eat, plan ahead. ?Choose " clean, green, fresh or frozen" over canned, processed or packaged foods which are more sugary, salty and fatty. ?70 to 75% of food eaten should be vegetables and fruit. ?Three meals at set times with snacks allowed between meals, but they must be fruit or vegetables. ?Aim to eat over a 12 hour period , example 7 am to 7 pm, and STOP after  your last meal of the day. ?Drink water,generally about 64 ounces per day, no other drink is as healthy. Fruit juice is best enjoyed in a healthy way, by EATING the fruit. ? ?Thanks for choosing [facility], we consider it a privelige to serve you.  ?

## 2022-02-25 NOTE — Assessment & Plan Note (Addendum)
Chronic condition ?Followed by orthopedics at the [facility] ?Takes Norco 7.5-325 mg tablet  BID PRN for pain ibuprofen 500 mg as needed for pain.  Patient states that she has upcoming appointments with pain management clinic ?Patient encouraged to engage in regular exercise. ?Patient encouraged to maintain close follow-up with orthopedics.  ? ?

## 2022-02-25 NOTE — Assessment & Plan Note (Signed)
Chronic condition uncontrolled ?GAD 7 score 9 GAD-7 score 19 ?Denies SI, HI ?Took Prozac 10 mg daily years ago. ?Stat Prozac 10 mg daily ?Hydroxyzine 25 mg 3 times daily as needed ?Follow-up in 6 weeks. ?Refused referral for counseling ?

## 2022-02-25 NOTE — Progress Notes (Signed)
? ?New Patient Office Visit ? ?Subjective:  ?Patient ID: Amanda Morales, female    DOB: 02/03/1986  Age: 36 y.o. MRN: [id number] ? ?CC:  ?Chief Complaint  ?Patient presents with  ? New Patient (Initial Visit)  ?  NP  ? Anxiety  ?  Has been off medication 3 years   ? ? ?HPI ?Amanda Morales with past medical history of spinal stenosis at L4-L5 level, HNP, lumbar number radiculopathy presents to establish care for anxiety. [facility], last physical was over 3 years ago. Goes to [facility] for her chronic orthopedic conditions. Will be going to see pain management specialist next month.  ? ? ?Has history of anxiety and depression, was taking clonazepam 1 mg , fluoxitine 10 mg daily. She got hurt at work lost her insurance and stopped taking medications ,she's not working currently, denies SI, HI.  ? ?Due for PAP smear , refuse flu vaccine today, does not take covid vaccine. Need for both vaccines discussed with pt she verbalized understanding.  ? ?Past Medical History:  ?Diagnosis Date  ? Anxiety   ? Asthma   ? as a child  ? Depression   ? GERD (gastroesophageal reflux disease)   ? ? ?Past Surgical History:  ?Procedure Laterality Date  ? BACK SURGERY    ? LUMBAR LAMINECTOMY/DECOMPRESSION MICRODISCECTOMY N/A 08/04/2018  ? Procedure: Microlumbar decompression Lumbar three-four, Revision Lumbar four-five;  Surgeon: Jene EveryBeane, Jeffrey, MD;  Location: [facility];  Service: Orthopedics;  Laterality: N/A;  ? TONSILLECTOMY    ? WISDOM TOOTH EXTRACTION    ? ? ?Family History  ?Problem Relation Age of Onset  ? Anxiety disorder Mother   ? Diabetes type II Mother   ? Diabetes Mellitus II Father   ? Hypertension Father   ? Anxiety disorder Brother   ? Lupus Maternal Aunt   ? Anxiety disorder Maternal Grandmother   ? Depression Maternal Grandmother   ? Cervical cancer Maternal Grandmother   ? COPD Maternal Grandfather   ? Liver cancer Maternal Grandfather   ? Melanoma Maternal Grandfather   ? Anxiety disorder Paternal  Grandmother   ? Depression Paternal Grandmother   ?     cancer on his lymph node  ? Breast cancer Neg Hx   ? ? ?Social History  ? ?Socioeconomic History  ? Marital status: Single  ?  Spouse name: Not on file  ? Number of children: Not on file  ? Years of education: Not on file  ? Highest education level: Not on file  ?Occupational History  ? Not on file  ?Tobacco Use  ? Smoking status: Every Day  ?  Packs/day: 0.50  ?  Years: 15.00  ?  Pack years: 7.50  ?  Types: Cigarettes  ? Smokeless tobacco: Never  ? Tobacco comments:  ?  She smokes 6-7 cigarettes daily.   ?Vaping Use  ? Vaping Use: Not on file  ?Substance and Sexual Activity  ? Alcohol use: Yes  ?  Comment: occ  ? Drug use: Yes  ?  Types: Marijuana  ?  Comment: 2 joints a week  ? Sexual activity: Yes  ?  Birth control/protection: None  ?Other Topics Concern  ? Not on file  ?Social History Narrative  ? Lives alone.   ? ?Social Determinants of Health  ? ?Financial Resource Strain: Not on file  ?Food Insecurity: Not on file  ?Transportation Needs: Not on file  ?Physical Activity: Not on file  ?Stress: Not on  file  ?Social Connections: Not on file  ?Intimate Partner Violence: Not on file  ? ? ?ROS ?Review of Systems  ?Constitutional: Negative.   ?Respiratory: Negative.    ?Cardiovascular: Negative.   ?Gastrointestinal: Negative.   ?Musculoskeletal:  Positive for arthralgias.  ?Psychiatric/Behavioral:  Negative for agitation, behavioral problems, confusion, decreased concentration, dysphoric mood, hallucinations, self-injury, sleep disturbance and suicidal ideas. The patient is nervous/anxious. The patient is not hyperactive.   ? ?Objective:  ? ?Today's Vitals: BP 130/78   Pulse 81   Ht 5\' 2"  (1.575 m)   Wt 204 lb (92.5 kg)   LMP 02/25/2022 (Exact Date)   SpO2 100%   BMI 37.31 kg/m?  ? ?Physical Exam ?Constitutional:   ?   General: She is not in acute distress. ?   Appearance: She is obese. She is not ill-appearing, toxic-appearing or diaphoretic.   ?Cardiovascular:  ?   Rate and Rhythm: Normal rate and regular rhythm.  ?   Pulses: Normal pulses.  ?   Heart sounds: Normal heart sounds. No murmur heard. ?  No friction rub. No gallop.  ?Pulmonary:  ?   Effort: Pulmonary effort is normal. No respiratory distress.  ?   Breath sounds: Normal breath sounds. No stridor. No wheezing, rhonchi or rales.  ?Chest:  ?   Chest wall: No tenderness.  ?Musculoskeletal:     ?   General: Tenderness present. No swelling, deformity or signs of injury.  ?   Right lower leg: No edema.  ?   Left lower leg: No edema.  ?   Comments: Tenderness on palpation of left low back, voiced tenderness on range of motion of left leg, Able to do range of motion of spine.  ?Neurological:  ?   Mental Status: She is alert.  ?Psychiatric:     ?   Mood and Affect: Mood normal.     ?   Behavior: Behavior normal.     ?   Thought Content: Thought content normal.     ?   Judgment: Judgment normal.  ? ? ?Assessment & Plan:  ? ?Problem List Items Addressed This Visit   ? ?  ? Nervous and Auditory  ? Lumbar radiculopathy, chronic  ?  Chronic condition ?Followed by orthopedics at the [facility] ?Takes Norco 7.5-325 mg tablet  BID PRN for pain ibuprofen 500 mg as needed for pain.  Patient states that she has upcoming appointments with pain management clinic ?Patient encouraged to engage in regular exercise. ?Patient encouraged to maintain close follow-up with orthopedics.  ? ?  ?  ? Relevant Medications  ? FLUoxetine (PROZAC) 10 MG capsule  ? hydrOXYzine (VISTARIL) 25 MG capsule  ?  ? Other  ? Anxiety and depression - Primary  ?  Chronic condition uncontrolled ?GAD 7 score 9 GAD-7 score 19 ?Denies SI, HI ?Took Prozac 10 mg daily years ago. ?Stat Prozac 10 mg daily ?Hydroxyzine 25 mg 3 times daily as needed ?Follow-up in 6 weeks. ?Refused referral for counseling ?  ?  ? Relevant Medications  ? FLUoxetine (PROZAC) 10 MG capsule  ? hydrOXYzine (VISTARIL) 25 MG capsule  ? Other Relevant Orders  ? HgB A1c  ? Lipid  Profile  ? TSH  ? Vitamin D (25 hydroxy)  ? ? ?Outpatient Encounter Medications as of 02/25/2022  ?Medication Sig  ? albuterol (VENTOLIN HFA) 108 (90 Base) MCG/ACT inhaler Inhale 1 puff into the lungs every 6 (six) hours as needed for wheezing or shortness of breath.  ?  FLUoxetine (PROZAC) 10 MG capsule Take 1 capsule (10 mg total) by mouth daily.  ? HYDROcodone-acetaminophen (NORCO) 7.5-325 MG tablet hydrocodone 7.5 mg-acetaminophen 325 mg tablet ? Take 1 tablet twice a day by oral route as needed for 7 days.  ? hydrOXYzine (VISTARIL) 25 MG capsule Take 1 capsule (25 mg total) by mouth every 8 (eight) hours as needed.  ? IBUPROFEN PO Take 500 mg by mouth as needed.  ? [DISCONTINUED] docusate sodium (COLACE) 100 MG capsule Take 1 capsule (100 mg total) by mouth 2 (two) times daily.  ? [DISCONTINUED] methocarbamol (ROBAXIN) 500 MG tablet Take 1 tablet (500 mg total) by mouth every 6 (six) hours as needed for muscle spasms.  ? [DISCONTINUED] oxyCODONE 10 MG TABS Take 1 tablet (10 mg total) by mouth every 3 (three) hours as needed for severe pain ((score 7 to 10)).  ? [DISCONTINUED] polyethylene glycol (MIRALAX) packet Take 17 g by mouth daily.  ? ?No facility-administered encounter medications on file as of 02/25/2022.  ? ? ?Follow-up: Return in about 6 weeks (around 04/08/2022) for annaul exam with PAP.  ? ?Donell Beers, FNP ? ?

## 2022-06-01 ENCOUNTER — Other Ambulatory Visit: Payer: Self-pay | Admitting: Nurse Practitioner

## 2022-06-01 DIAGNOSIS — F419 Anxiety disorder, unspecified: Secondary | ICD-10-CM

## 2022-06-02 ENCOUNTER — Encounter: Payer: Medicaid Other | Admitting: Nurse Practitioner

## 2022-06-02 MED ORDER — HYDROXYZINE PAMOATE 25 MG PO CAPS: 25.0000 mg | ORAL_CAPSULE | Freq: Three times a day (TID) | ORAL | 0 refills | Status: DC | PRN

## 2022-06-02 MED ORDER — ALBUTEROL SULFATE HFA 108 (90 BASE) MCG/ACT IN AERS: 1.0000 | INHALATION_SPRAY | Freq: Four times a day (QID) | RESPIRATORY_TRACT | 3 refills | Status: AC | PRN

## 2022-07-02 ENCOUNTER — Encounter: Payer: Medicaid Other | Admitting: Nurse Practitioner

## 2022-08-13 ENCOUNTER — Other Ambulatory Visit: Payer: Self-pay | Admitting: Nurse Practitioner

## 2022-08-13 DIAGNOSIS — F32A Depression, unspecified: Secondary | ICD-10-CM

## 2022-08-13 MED ORDER — HYDROXYZINE PAMOATE 25 MG PO CAPS: 25.0000 mg | ORAL_CAPSULE | Freq: Three times a day (TID) | ORAL | 0 refills | Status: DC | PRN

## 2022-09-09 ENCOUNTER — Other Ambulatory Visit: Payer: Self-pay | Admitting: Nurse Practitioner

## 2022-09-09 DIAGNOSIS — F419 Anxiety disorder, unspecified: Secondary | ICD-10-CM

## 2022-09-10 MED ORDER — HYDROXYZINE PAMOATE 25 MG PO CAPS: 25.0000 mg | ORAL_CAPSULE | Freq: Three times a day (TID) | ORAL | 0 refills | Status: DC | PRN

## 2022-11-23 ENCOUNTER — Other Ambulatory Visit: Payer: Self-pay | Admitting: Nurse Practitioner

## 2022-11-23 DIAGNOSIS — F419 Anxiety disorder, unspecified: Secondary | ICD-10-CM

## 2022-11-23 MED ORDER — FLUOXETINE HCL 10 MG PO CAPS: 10.0000 mg | ORAL_CAPSULE | Freq: Every day | ORAL | 0 refills | Status: DC

## 2023-01-18 DIAGNOSIS — M5416 Radiculopathy, lumbar region: Secondary | ICD-10-CM | POA: Diagnosis not present

## 2023-01-18 DIAGNOSIS — Z5181 Encounter for therapeutic drug level monitoring: Secondary | ICD-10-CM | POA: Diagnosis not present

## 2023-01-18 DIAGNOSIS — Z79899 Other long term (current) drug therapy: Secondary | ICD-10-CM | POA: Diagnosis not present

## 2023-01-21 ENCOUNTER — Encounter: Payer: Self-pay | Admitting: Family Medicine

## 2023-01-21 ENCOUNTER — Ambulatory Visit: Payer: Medicaid Other | Admitting: Family Medicine

## 2023-02-15 DIAGNOSIS — M48061 Spinal stenosis, lumbar region without neurogenic claudication: Secondary | ICD-10-CM | POA: Diagnosis not present

## 2023-02-15 DIAGNOSIS — Z79899 Other long term (current) drug therapy: Secondary | ICD-10-CM | POA: Diagnosis not present

## 2023-02-15 DIAGNOSIS — Z5181 Encounter for therapeutic drug level monitoring: Secondary | ICD-10-CM | POA: Diagnosis not present

## 2023-02-15 DIAGNOSIS — M5136 Other intervertebral disc degeneration, lumbar region: Secondary | ICD-10-CM | POA: Diagnosis not present

## 2023-02-15 DIAGNOSIS — M5451 Vertebrogenic low back pain: Secondary | ICD-10-CM | POA: Diagnosis not present

## 2023-02-15 DIAGNOSIS — M5416 Radiculopathy, lumbar region: Secondary | ICD-10-CM | POA: Diagnosis not present

## 2023-02-22 ENCOUNTER — Other Ambulatory Visit: Payer: Self-pay | Admitting: Family Medicine

## 2023-02-22 DIAGNOSIS — F419 Anxiety disorder, unspecified: Secondary | ICD-10-CM

## 2023-02-22 MED ORDER — FLUOXETINE HCL 10 MG PO CAPS: 10.0000 mg | ORAL_CAPSULE | Freq: Every day | ORAL | 0 refills | Status: DC

## 2023-02-22 MED ORDER — HYDROXYZINE PAMOATE 25 MG PO CAPS: 25.0000 mg | ORAL_CAPSULE | Freq: Three times a day (TID) | ORAL | 0 refills | Status: DC | PRN

## 2023-07-05 ENCOUNTER — Other Ambulatory Visit: Payer: Self-pay | Admitting: Family Medicine

## 2023-07-05 DIAGNOSIS — F419 Anxiety disorder, unspecified: Secondary | ICD-10-CM

## 2023-07-21 ENCOUNTER — Encounter: Payer: Self-pay | Admitting: Family Medicine

## 2023-07-21 ENCOUNTER — Ambulatory Visit (INDEPENDENT_AMBULATORY_CARE_PROVIDER_SITE_OTHER): Payer: Medicaid Other | Admitting: Family Medicine

## 2023-07-21 VITALS — BP 138/92 | HR 100 | Ht 62.0 in | Wt 216.0 lb

## 2023-07-21 DIAGNOSIS — R030 Elevated blood-pressure reading, without diagnosis of hypertension: Secondary | ICD-10-CM | POA: Diagnosis not present

## 2023-07-21 DIAGNOSIS — Z131 Encounter for screening for diabetes mellitus: Secondary | ICD-10-CM

## 2023-07-21 DIAGNOSIS — F419 Anxiety disorder, unspecified: Secondary | ICD-10-CM | POA: Diagnosis not present

## 2023-07-21 DIAGNOSIS — E538 Deficiency of other specified B group vitamins: Secondary | ICD-10-CM | POA: Diagnosis not present

## 2023-07-21 DIAGNOSIS — R252 Cramp and spasm: Secondary | ICD-10-CM

## 2023-07-21 DIAGNOSIS — F32A Depression, unspecified: Secondary | ICD-10-CM

## 2023-07-21 DIAGNOSIS — M79604 Pain in right leg: Secondary | ICD-10-CM | POA: Diagnosis not present

## 2023-07-21 DIAGNOSIS — M79606 Pain in leg, unspecified: Secondary | ICD-10-CM | POA: Insufficient documentation

## 2023-07-21 DIAGNOSIS — Z13220 Encounter for screening for lipoid disorders: Secondary | ICD-10-CM

## 2023-07-21 DIAGNOSIS — Z1329 Encounter for screening for other suspected endocrine disorder: Secondary | ICD-10-CM

## 2023-07-21 DIAGNOSIS — Z1159 Encounter for screening for other viral diseases: Secondary | ICD-10-CM

## 2023-07-21 DIAGNOSIS — G8929 Other chronic pain: Secondary | ICD-10-CM

## 2023-07-21 DIAGNOSIS — E559 Vitamin D deficiency, unspecified: Secondary | ICD-10-CM

## 2023-07-21 DIAGNOSIS — Z114 Encounter for screening for human immunodeficiency virus [HIV]: Secondary | ICD-10-CM

## 2023-07-21 MED ORDER — METHOCARBAMOL 500 MG PO TABS
500.0000 mg | ORAL_TABLET | Freq: Three times a day (TID) | ORAL | 2 refills | Status: DC | PRN
Start: 2023-07-21 — End: 2024-02-09

## 2023-07-21 MED ORDER — PAROXETINE HCL 10 MG PO TABS: 10.0000 mg | ORAL_TABLET | Freq: Every evening | ORAL | 3 refills | Status: DC

## 2023-07-21 MED ORDER — BUPROPION HCL ER (XL) 150 MG PO TB24: 150.0000 mg | ORAL_TABLET | ORAL | 3 refills | Status: DC

## 2023-07-21 MED ORDER — HYDROXYZINE PAMOATE 25 MG PO CAPS: 25.0000 mg | ORAL_CAPSULE | Freq: Three times a day (TID) | ORAL | 0 refills | Status: DC | PRN

## 2023-07-21 MED ORDER — PAROXETINE HCL 10 MG PO TABS: 10.0000 mg | ORAL_TABLET | Freq: Every day | ORAL | 3 refills | Status: DC

## 2023-07-21 NOTE — Assessment & Plan Note (Signed)
Vitals:   07/21/23 0951 07/21/23 0952  BP: (!) 162/89 (!) 138/92   Blood pressure not controlled in today's visit Follow up in 1 week with at home blood pressure reading to monitor trends Continued discussion on DASH diet, low sodium diet and maintain a exercise routine for 150 minutes per week.

## 2023-07-21 NOTE — Progress Notes (Signed)
Patient Office Visit   Subjective   Patient ID: Amanda Morales, female    DOB: [birth year]  Age: 37 y.o. MRN: [id number]  CC:  Chief Complaint  Patient presents with   Depression   Anxiety    Patient is here for f/u of anxiety and depression. Needs med refills.    Leg Pain    Patient complains of R leg feeling like pins, needles and numb, with foot swelling, cramping and spasms. Leg gave out on her a month ago. Starting a year ago but worsening in the last 6 months.     HPI Amanda Morales 37 year old female, presents to the clinic for anxiety and depression follow up. She  has a past medical history of Anxiety, Asthma, Depression, and GERD (gastroesophageal reflux disease).  Right leg   Leg Pain  The injury mechanism was a direct blow. The pain is present in the right leg. The quality of the pain is described as stabbing, shooting, cramping and aching (pin and needles sensation). The pain is at a severity of 10/10. The pain has been Constant since onset. Associated symptoms include muscle weakness, numbness and tingling. Pertinent negatives include no inability to bear weight, loss of motion or loss of sensation. She reports no foreign bodies present. The symptoms are aggravated by movement, palpation and weight bearing. She has tried NSAIDs for the symptoms. The treatment provided no relief.      Outpatient Encounter Medications as of 07/21/2023  Medication Sig   albuterol (VENTOLIN HFA) 108 (90 Base) MCG/ACT inhaler Inhale 1 puff into the lungs every 6 (six) hours as needed for wheezing or shortness of breath.   buPROPion (WELLBUTRIN XL) 150 MG 24 hr tablet Take 1 tablet (150 mg total) by mouth every morning.   HYDROcodone-acetaminophen (NORCO) 10-325 MG tablet Take by oral route for 15 days.   HYDROcodone-acetaminophen (NORCO) 7.5-325 MG tablet hydrocodone 7.5 mg-acetaminophen 325 mg tablet  Take 1 tablet twice a day by oral route as needed for 7 days.   methocarbamol  (ROBAXIN) 500 MG tablet Take 1 tablet (500 mg total) by mouth every 8 (eight) hours as needed for muscle spasms.   [DISCONTINUED] FLUoxetine (PROZAC) 10 MG capsule Take 1 capsule (10 mg total) by mouth daily.   [DISCONTINUED] hydrOXYzine (ATARAX) 25 MG tablet    [DISCONTINUED] hydrOXYzine (VISTARIL) 25 MG capsule Take 1 capsule (25 mg total) by mouth every 8 (eight) hours as needed.   [DISCONTINUED] PARoxetine (PAXIL) 10 MG tablet Take 1 tablet (10 mg total) by mouth daily.   hydrOXYzine (VISTARIL) 25 MG capsule Take 1 capsule (25 mg total) by mouth every 8 (eight) hours as needed.   PARoxetine (PAXIL) 10 MG tablet Take 1 tablet (10 mg total) by mouth at bedtime.   No facility-administered encounter medications on file as of 07/21/2023.    Past Surgical History:  Procedure Laterality Date   BACK SURGERY     LUMBAR LAMINECTOMY/DECOMPRESSION MICRODISCECTOMY N/A 08/04/2018   Procedure: Microlumbar decompression Lumbar three-four, Revision Lumbar four-five;  Surgeon: Jene Every, MD;  Location: [facility] OR;  Service: Orthopedics;  Laterality: N/A;   TONSILLECTOMY     WISDOM TOOTH EXTRACTION      Review of Systems  Constitutional:  Negative for chills and fever.  Eyes:  Negative for blurred vision.  Respiratory:  Negative for shortness of breath.   Cardiovascular:  Negative for chest pain.  Musculoskeletal:  Positive for back pain, joint pain and myalgias.  Neurological:  Positive  for tingling and numbness.  Psychiatric/Behavioral:  Positive for depression. The patient is nervous/anxious and has insomnia.       Objective    BP (!) 138/92   Pulse 100   Ht 5\' 2"  (1.575 m)   Wt 216 lb (98 kg)   SpO2 98%   BMI 39.51 kg/m   Physical Exam Vitals reviewed.  Constitutional:      General: She is not in acute distress.    Appearance: Normal appearance. She is not ill-appearing, toxic-appearing or diaphoretic.  HENT:     Head: Normocephalic.  Eyes:     General:        Right eye: No  discharge.        Left eye: No discharge.     Conjunctiva/sclera: Conjunctivae normal.  Cardiovascular:     Rate and Rhythm: Normal rate.     Pulses: Normal pulses.     Heart sounds: Normal heart sounds.  Pulmonary:     Effort: Pulmonary effort is normal. No respiratory distress.     Breath sounds: Normal breath sounds.  Musculoskeletal:     Cervical back: Normal range of motion.     Lumbar back: Decreased range of motion.     Right lower leg: Swelling and tenderness present.     Left lower leg: No swelling or tenderness.  Skin:    General: Skin is warm and dry.     Capillary Refill: Capillary refill takes less than 2 seconds.  Neurological:     General: No focal deficit present.     Mental Status: She is alert and oriented to person, place, and time.     Coordination: Coordination normal.     Gait: Gait normal.  Psychiatric:        Mood and Affect: Mood normal.        Behavior: Behavior normal.       Assessment & Plan:  Anxiety and depression Assessment & Plan: Flowsheet Row Office Visit from 07/21/2023 in [facility]  PHQ-9 Total Score 9          07/21/2023    9:54 AM 02/25/2022   11:21 AM  GAD 7 : Generalized Anxiety Score  Nervous, Anxious, on Edge 3 3  Control/stop worrying 3 3  Worry too much - different things 3 3  Trouble relaxing 3 3  Restless 2 3  Easily annoyed or irritable 2 3  Afraid - awful might happen 3 1  Total GAD 7 Score 19 19  Anxiety Difficulty Somewhat difficult Very difficult  Patient stopped taking Prozac a several weeks ago and would like to try something else  Trial Paxil 10 mg at bedtime and Wellbutrin 150 mg daily Discussed about cognitive behavioral therapy focusing on thoughts, belief, and attitudes that affects feelings and behavior, learning about coping skills to deal with certain problems. Maintaining a consistent routine and schedule, Practice stress management and self calming techniques, excersise  regularly and spend time outdoors, Do not eat food that are high in fat, added sugar, or salt. Follow up in 6 weeks   Orders: -     hydrOXYzine Pamoate; Take 1 capsule (25 mg total) by mouth every 8 (eight) hours as needed.  Dispense: 30 capsule; Refill: 0  Screening for diabetes mellitus -     Microalbumin / creatinine urine ratio -     Hemoglobin A1c  Need for hepatitis C screening test -     Hepatitis C antibody  Vitamin D deficiency -  VITAMIN D 25 Hydroxy (Vit-D Deficiency, Fractures)  Screening for HIV (human immunodeficiency virus) -     HIV Antibody (routine testing w rflx)  Screening for thyroid disorder -     TSH + free T4  Screening for lipid disorders -     Lipid panel -     CMP14+EGFR -     CBC with Differential/Platelet  Vitamin B12 deficiency -     Vitamin B12  Leg cramping -     Iron, TIBC and Ferritin Panel -     Methocarbamol; Take 1 tablet (500 mg total) by mouth every 8 (eight) hours as needed for muscle spasms.  Dispense: 30 tablet; Refill: 2  Other chronic pain -     Ambulatory referral to Pain Clinic  Pain of right lower extremity Assessment & Plan: Chronic- 6 months  Labs ordered awaiting results will follow up Robaxin 500 mg PRN Explained to patient Non pharmacological interventions include the use of ice or heat, rest, recommend range of motion exercises, gentle stretching. The use of NSAIDs for pain management.  Follow up for worsening or persistent symptoms. Patient verbalizes understanding regarding plan of care and all questions answered.    Elevated blood pressure reading Assessment & Plan: Vitals:   07/21/23 0951 07/21/23 0952  BP: (!) 162/89 (!) 138/92   Blood pressure not controlled in today's visit Follow up in 1 week with at home blood pressure reading to monitor trends Continued discussion on DASH diet, low sodium diet and maintain a exercise routine for 150 minutes per week.    Other orders -     buPROPion HCl ER  (XL); Take 1 tablet (150 mg total) by mouth every morning.  Dispense: 30 tablet; Refill: 3 -     PARoxetine HCl; Take 1 tablet (10 mg total) by mouth at bedtime.  Dispense: 30 tablet; Refill: 3    Return in about 6 weeks (around 09/01/2023), or if symptoms worsen or fail to improve, for Pap smear, depression anxiety, medication managment.   Cruzita Lederer Newman Nip, FNP

## 2023-07-21 NOTE — Assessment & Plan Note (Signed)
Flowsheet Row Office Visit from 07/21/2023 in [facility]  PHQ-9 Total Score 9          07/21/2023    9:54 AM 02/25/2022   11:21 AM  GAD 7 : Generalized Anxiety Score  Nervous, Anxious, on Edge 3 3  Control/stop worrying 3 3  Worry too much - different things 3 3  Trouble relaxing 3 3  Restless 2 3  Easily annoyed or irritable 2 3  Afraid - awful might happen 3 1  Total GAD 7 Score 19 19  Anxiety Difficulty Somewhat difficult Very difficult  Patient stopped taking Prozac a several weeks ago and would like to try something else  Trial Paxil 10 mg at bedtime and Wellbutrin 150 mg daily Discussed about cognitive behavioral therapy focusing on thoughts, belief, and attitudes that affects feelings and behavior, learning about coping skills to deal with certain problems. Maintaining a consistent routine and schedule, Practice stress management and self calming techniques, excersise regularly and spend time outdoors, Do not eat food that are high in fat, added sugar, or salt. Follow up in 6 weeks

## 2023-07-21 NOTE — Assessment & Plan Note (Addendum)
Chronic- 6 months  Labs ordered awaiting results will follow up Robaxin 500 mg PRN Explained to patient Non pharmacological interventions include the use of ice or heat, rest, recommend range of motion exercises, gentle stretching. The use of NSAIDs for pain management.  Follow up for worsening or persistent symptoms. Patient verbalizes understanding regarding plan of care and all questions answered.

## 2023-07-21 NOTE — Patient Instructions (Signed)

## 2023-07-23 LAB — LIPID PANEL
Chol/HDL Ratio: 3.5 ratio (ref 0.0–4.4)
Cholesterol, Total: 156 mg/dL (ref 100–199)
HDL: 45 mg/dL (ref >39)
LDL Chol Calc (NIH): 85 mg/dL (ref 0–99)
Triglycerides: 147 mg/dL (ref 0–149)
VLDL Cholesterol Cal: 26 mg/dL (ref 5–40)

## 2023-07-23 LAB — HEMOGLOBIN A1C
Est. average glucose Bld gHb Est-mCnc: 143 mg/dL
Hgb A1c MFr Bld: 6.6 % — ABNORMAL HIGH (ref 4.8–5.6)

## 2023-07-23 LAB — CBC WITH DIFFERENTIAL/PLATELET
Basophils Absolute: 0.1 10*3/uL (ref 0.0–0.2)
Basos: 1 %
EOS (ABSOLUTE): 0.2 10*3/uL (ref 0.0–0.4)
Eos: 3 %
Hematocrit: 44.3 % (ref 34.0–46.6)
Hemoglobin: 15.4 g/dL (ref 11.1–15.9)
Immature Grans (Abs): 0 10*3/uL (ref 0.0–0.1)
Immature Granulocytes: 0 %
Lymphocytes Absolute: 1.8 10*3/uL (ref 0.7–3.1)
Lymphs: 19 %
MCH: 33.2 pg — ABNORMAL HIGH (ref 26.6–33.0)
MCHC: 34.8 g/dL (ref 31.5–35.7)
MCV: 96 fL (ref 79–97)
Monocytes Absolute: 0.5 10*3/uL (ref 0.1–0.9)
Monocytes: 5 %
Neutrophils Absolute: 6.8 10*3/uL (ref 1.4–7.0)
Neutrophils: 72 %
Platelets: 210 10*3/uL (ref 150–450)
RBC: 4.64 x10E6/uL (ref 3.77–5.28)
RDW: 11.7 % (ref 11.7–15.4)
WBC: 9.3 10*3/uL (ref 3.4–10.8)

## 2023-07-23 LAB — HEPATITIS C ANTIBODY: Hep C Virus Ab: NONREACTIVE

## 2023-07-23 LAB — CMP14+EGFR
ALT: 46 IU/L — ABNORMAL HIGH (ref 0–32)
AST: 37 IU/L (ref 0–40)
Albumin: 4.8 g/dL (ref 3.9–4.9)
Alkaline Phosphatase: 54 IU/L (ref 44–121)
BUN/Creatinine Ratio: 15 (ref 9–23)
BUN: 11 mg/dL (ref 6–20)
Bilirubin Total: 0.5 mg/dL (ref 0.0–1.2)
CO2: 19 mmol/L — ABNORMAL LOW (ref 20–29)
Calcium: 9.6 mg/dL (ref 8.7–10.2)
Chloride: 101 mmol/L (ref 96–106)
Creatinine, Ser: 0.74 mg/dL (ref 0.57–1.00)
Globulin, Total: 2.5 g/dL (ref 1.5–4.5)
Glucose: 131 mg/dL — ABNORMAL HIGH (ref 70–99)
Potassium: 4 mmol/L (ref 3.5–5.2)
Sodium: 139 mmol/L (ref 134–144)
Total Protein: 7.3 g/dL (ref 6.0–8.5)
eGFR: 106 mL/min/{1.73_m2} (ref >59)

## 2023-07-23 LAB — IRON,TIBC AND FERRITIN PANEL
Ferritin: 396 ng/mL — ABNORMAL HIGH (ref 15–150)
Iron Saturation: 24 % (ref 15–55)
Iron: 92 ug/dL (ref 27–159)
Total Iron Binding Capacity: 387 ug/dL (ref 250–450)
UIBC: 295 ug/dL (ref 131–425)

## 2023-07-23 LAB — VITAMIN B12: Vitamin B-12: 438 pg/mL (ref 232–1245)

## 2023-07-23 LAB — VITAMIN D 25 HYDROXY (VIT D DEFICIENCY, FRACTURES): Vit D, 25-Hydroxy: 33.7 ng/mL (ref 30.0–100.0)

## 2023-07-23 LAB — HIV ANTIBODY (ROUTINE TESTING W REFLEX): HIV Screen 4th Generation wRfx: NONREACTIVE

## 2023-07-23 LAB — TSH+FREE T4
Free T4: 1.09 ng/dL (ref 0.82–1.77)
TSH: 2.66 u[IU]/mL (ref 0.450–4.500)

## 2023-07-23 LAB — MICROALBUMIN / CREATININE URINE RATIO
Creatinine, Urine: 72.2 mg/dL
Microalb/Creat Ratio: 11 mg/g{creat} (ref 0–29)
Microalbumin, Urine: 7.8 ug/mL

## 2023-07-25 ENCOUNTER — Other Ambulatory Visit: Payer: Self-pay | Admitting: Family Medicine

## 2023-07-25 MED ORDER — METFORMIN HCL 500 MG PO TABS: 500.0000 mg | ORAL_TABLET | Freq: Every day | ORAL | 1 refills | Status: DC

## 2023-07-26 ENCOUNTER — Encounter: Payer: Self-pay | Admitting: Family Medicine

## 2023-08-03 ENCOUNTER — Ambulatory Visit: Payer: Medicaid Other | Admitting: Family Medicine

## 2023-09-01 ENCOUNTER — Ambulatory Visit: Payer: Medicaid Other | Admitting: Family Medicine

## 2023-09-18 IMAGING — CT CT HEAD W/O CM
3 series · 15 of 47 positions shown, 18 images · non-contrast
Comparison: None.

CLINICAL DATA: Chronic headaches hand bilateral arm numbness

EXAM:
CT HEAD WITHOUT CONTRAST
TECHNIQUE: Contiguous axial images were obtained from the base of the skull
through the vertex without intravenous contrast.

[Series 2: head w o · axial · 0.41mm/px · z∈[+256,+381]mm · 9 of 30 slices shown, 12 images]
[im 3/30  brain]
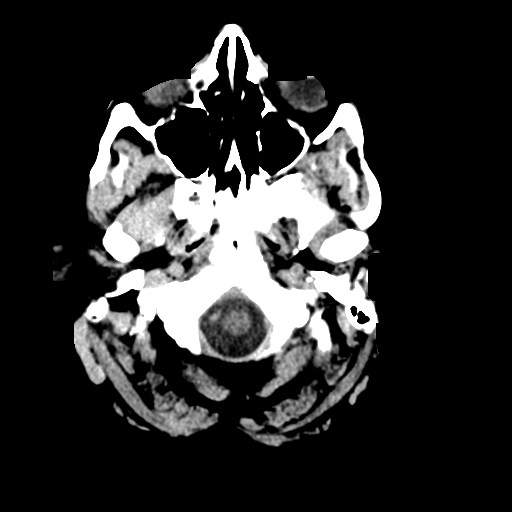
[im 3/30  bone]
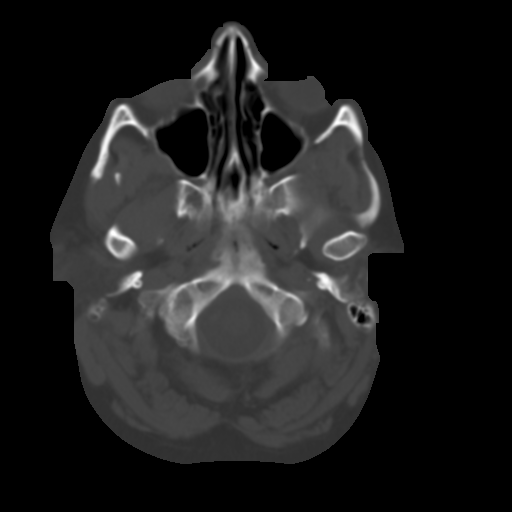
[im 6/30  brain]
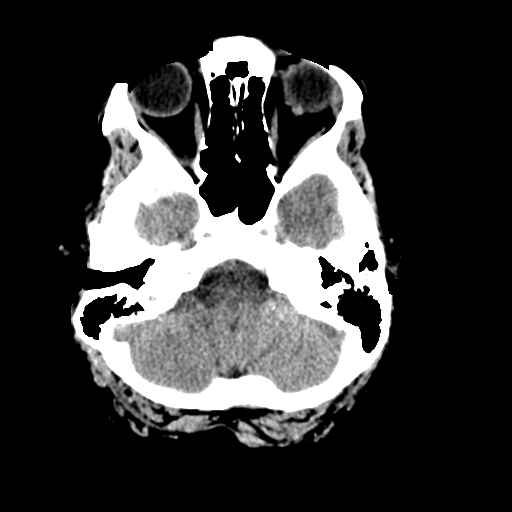
[im 9/30  brain]
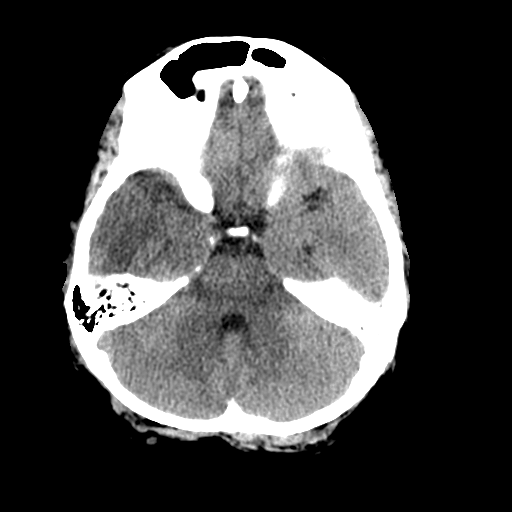
[im 12/30  brain]
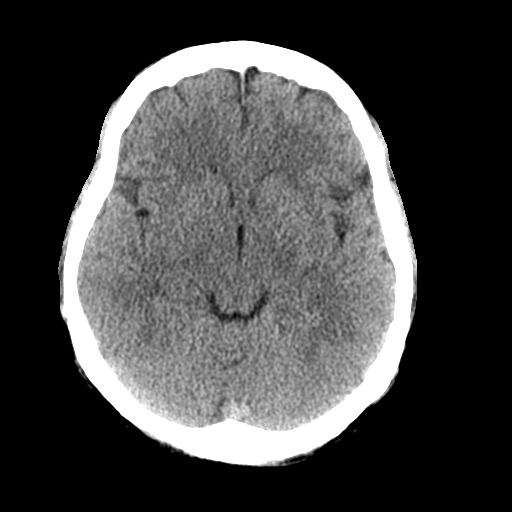
[im 16/30  brain]
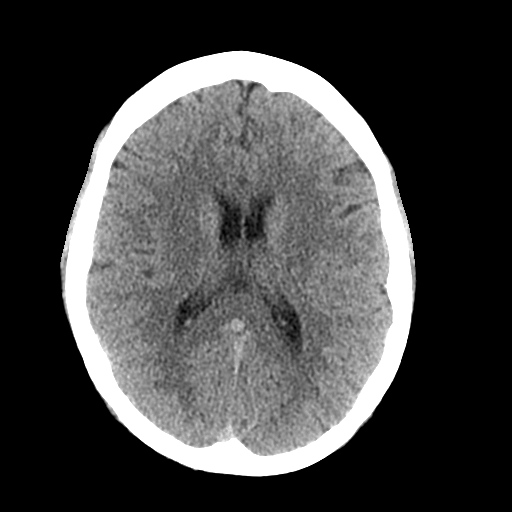
[im 16/30  bone]
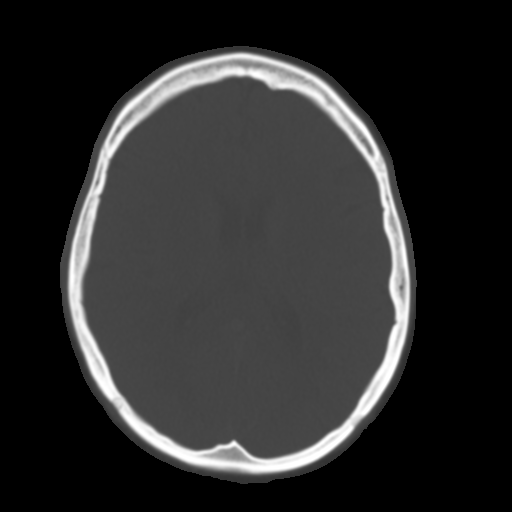
[im 19/30  brain]
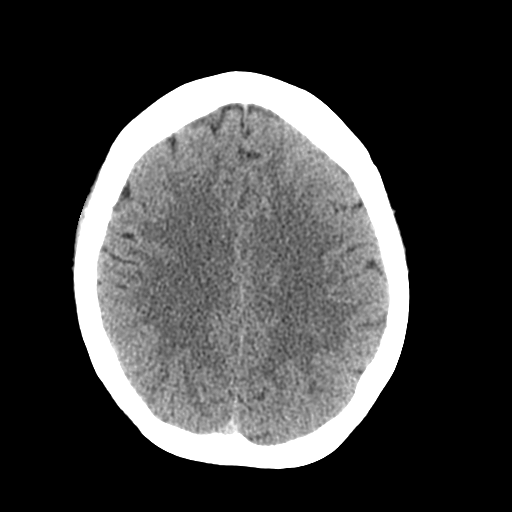
[im 22/30  brain]
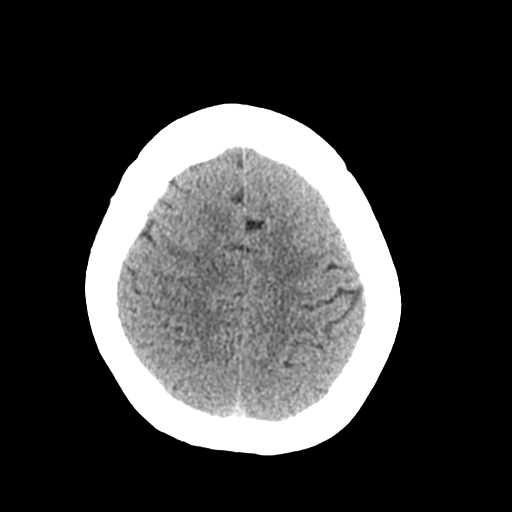
[im 25/30  brain]
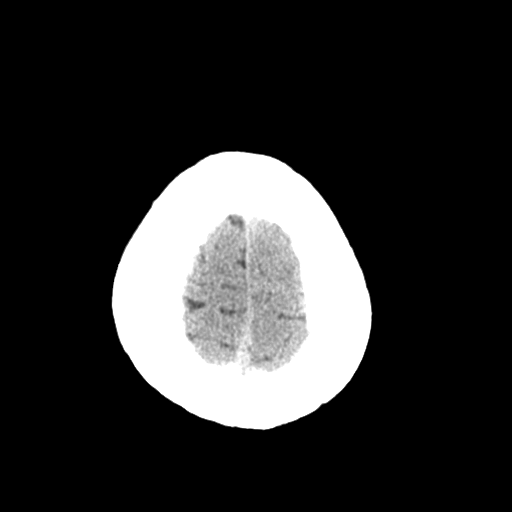
[im 28/30  brain]
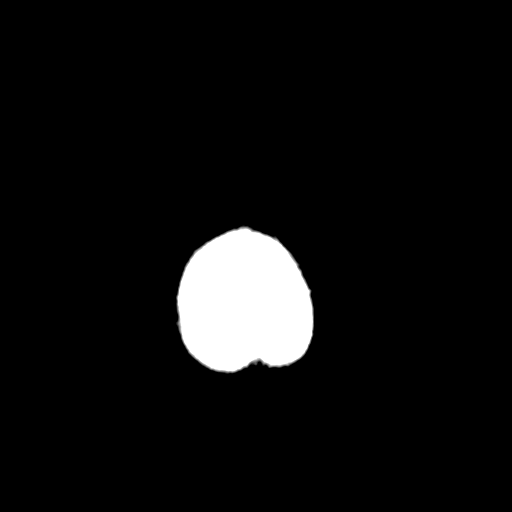
[im 28/30  bone]
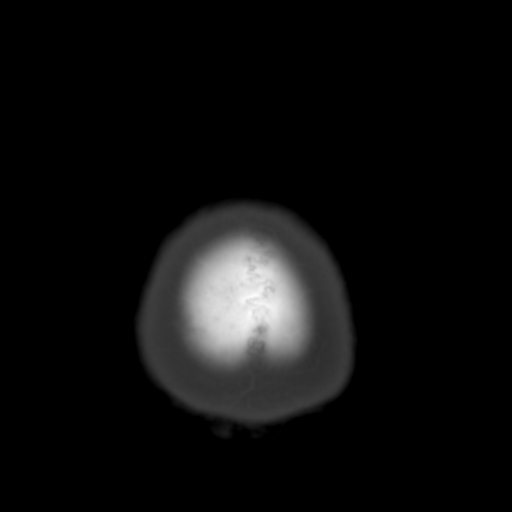

[Series 4: coronal soft · coronal · 0.28mm/px · 3 of 67 slices shown]
[im 23/67  brain]
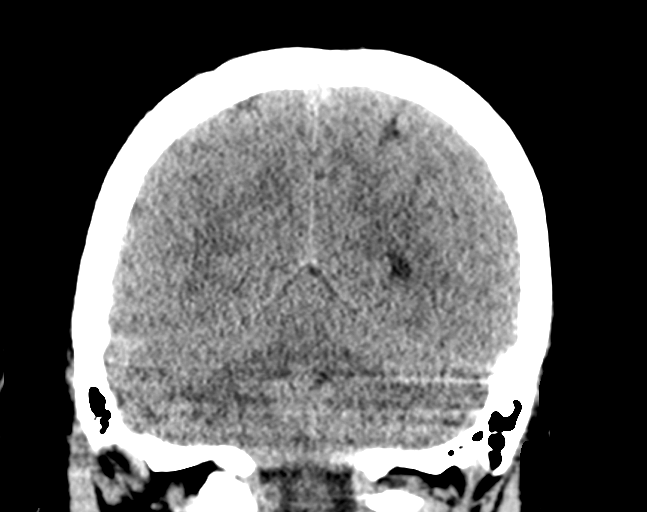
[im 30/67  brain]
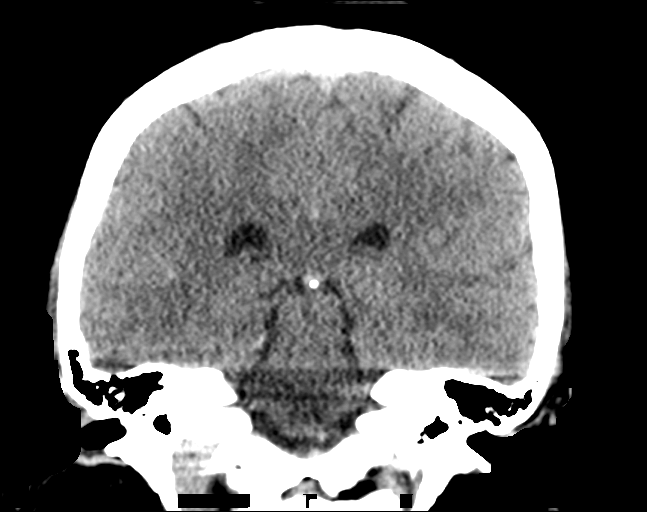
[im 37/67  brain]
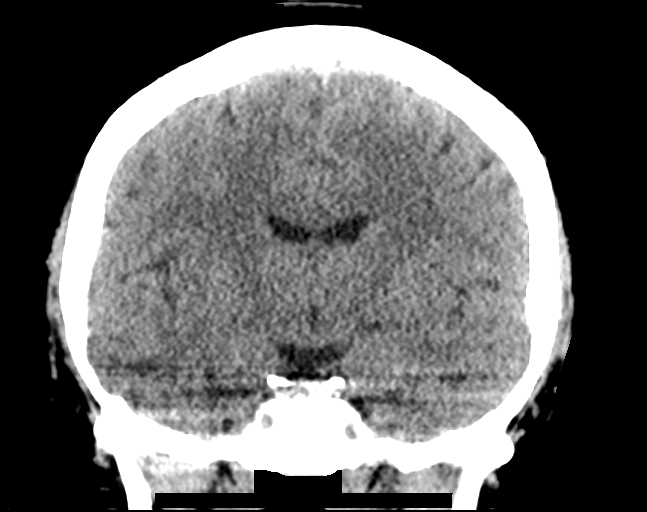

[Series 5: sagittal soft · sagittal · 0.33mm/px · 3 of 58 slices shown]
[im 20/58  brain]
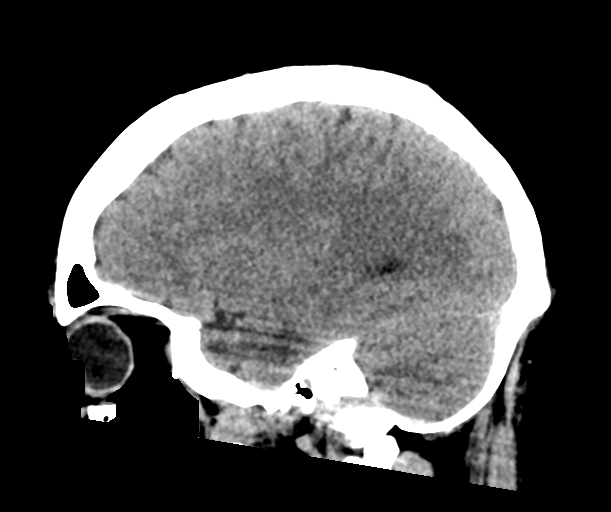
[im 29/58  brain]
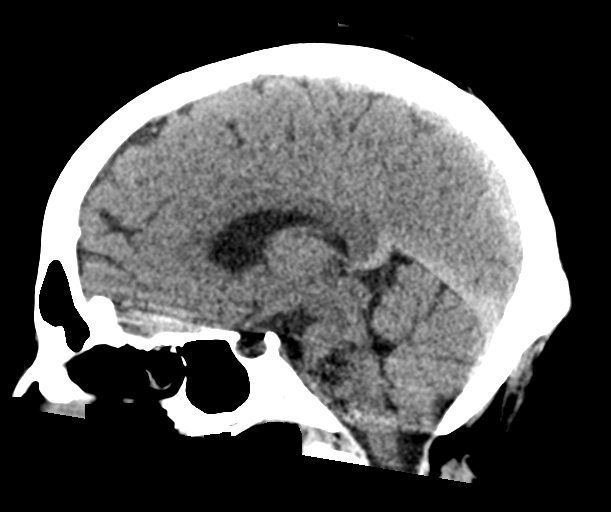
[im 39/58  brain]
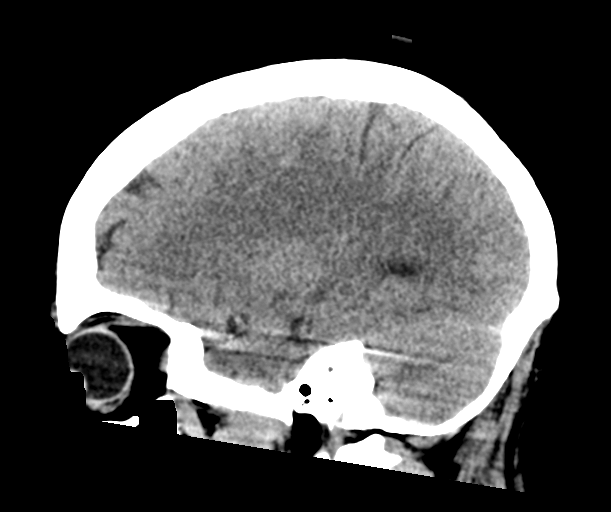

[15 of 47 positions shown; findings below may reference images not displayed]

FINDINGS: Brain: No evidence of acute infarction, hemorrhage, hydrocephalus,
extra-axial collection or mass lesion/mass effect.

Vascular: No hyperdense vessel or unexpected calcification.

Skull: Normal. Negative for fracture or focal lesion.

Sinuses/Orbits: Orbits and their contents are within normal limits.
Mucosal retention cyst is noted within the right maxillary antrum.

Other: Fusion defects are noted in the C1 ring both anteriorly and
posteriorly.
IMPRESSION: No acute intracranial abnormality noted.

Right maxillary mucosal retention cyst.

Note is made of fusion defects both anteriorly and posteriorly at
C1.

## 2023-11-01 ENCOUNTER — Ambulatory Visit (INDEPENDENT_AMBULATORY_CARE_PROVIDER_SITE_OTHER): Payer: Medicaid Other | Admitting: Internal Medicine

## 2023-11-01 ENCOUNTER — Encounter: Payer: Self-pay | Admitting: Internal Medicine

## 2023-11-01 VITALS — BP 144/84 | HR 86 | Ht 62.0 in | Wt 215.2 lb

## 2023-11-01 DIAGNOSIS — R030 Elevated blood-pressure reading, without diagnosis of hypertension: Secondary | ICD-10-CM | POA: Diagnosis not present

## 2023-11-01 DIAGNOSIS — J0110 Acute frontal sinusitis, unspecified: Secondary | ICD-10-CM | POA: Insufficient documentation

## 2023-11-01 MED ORDER — FLUTICASONE PROPIONATE 50 MCG/ACT NA SUSP
2.0000 | Freq: Every day | NASAL | 1 refills | Status: DC
Start: 2023-11-01 — End: 2024-09-21

## 2023-11-01 MED ORDER — AZITHROMYCIN 250 MG PO TABS
ORAL_TABLET | ORAL | 0 refills | Status: AC
Start: 2023-11-01 — End: 2023-11-06

## 2023-11-01 NOTE — Assessment & Plan Note (Signed)
Started empiric azithromycin as she has persistent symptoms despite symptomatic treatment Flonase for nasal congestion Avoid Sudafed for more than 3 consecutive days Use humidifier and/or vaporizer to avoid dry air exposure and for nasal congestion

## 2023-11-01 NOTE — Assessment & Plan Note (Signed)
BP Readings from Last 1 Encounters:  11/01/23 (!) 144/84   Uncontrolled Could be due to decongestant and h/o smoking Advised to f/u with PCP after 2 weeks Advised DASH diet and moderate exercise/walking, at least 150 mins/week

## 2023-11-01 NOTE — Progress Notes (Signed)
Acute Office Visit  Subjective:    Patient ID: Amanda Morales, female    DOB: 1986-04-25, 37 y.o.   MRN: [id number]  Chief Complaint  Patient presents with   URI    Cough , bloody nose, congestion , left ear sore, no fever, covid negative    HPI Patient is in today for, complaint of cough, nasal congestion and bloodstained nasal drainage for the last 5 days.  She has postnasal drip, sinus pressure related headache and sore throat as well.  Denies any fever, chills, dyspnea or wheezing.  Her home COVID test was negative.  She has tried taking Sudafed with mild relief pain in nasal congestion.  Of note, she has smoking history.  Past Medical History:  Diagnosis Date   Anxiety    Asthma    as a child   Depression    GERD (gastroesophageal reflux disease)     Past Surgical History:  Procedure Laterality Date   BACK SURGERY     LUMBAR LAMINECTOMY/DECOMPRESSION MICRODISCECTOMY N/A 08/04/2018   Procedure: Microlumbar decompression Lumbar three-four, Revision Lumbar four-five;  Surgeon: Jene Every, MD;  Location: [facility] OR;  Service: Orthopedics;  Laterality: N/A;   TONSILLECTOMY     WISDOM TOOTH EXTRACTION      Family History  Problem Relation Age of Onset   Anxiety disorder Mother    Diabetes type II Mother    Diabetes Mellitus II Father    Hypertension Father    Anxiety disorder Brother    Lupus Maternal Aunt    Anxiety disorder Maternal Grandmother    Depression Maternal Grandmother    Cervical cancer Maternal Grandmother    COPD Maternal Grandfather    Liver cancer Maternal Grandfather    Melanoma Maternal Grandfather    Anxiety disorder Paternal Grandmother    Depression Paternal Grandmother        cancer on his lymph node   Breast cancer Neg Hx     Social History   Socioeconomic History   Marital status: Single    Spouse name: Not on file   Number of children: Not on file   Years of education: Not on file   Highest education level: 10th grade   Occupational History   Not on file  Tobacco Use   Smoking status: Every Day    Current packs/day: 0.50    Average packs/day: 0.5 packs/day for 15.0 years (7.5 ttl pk-yrs)    Types: Cigarettes   Smokeless tobacco: Never   Tobacco comments:    She smokes 6-7 cigarettes daily.   Vaping Use   Vaping status: Not on file  Substance and Sexual Activity   Alcohol use: Yes    Comment: occ   Drug use: Yes    Types: Marijuana    Comment: 2 joints a week   Sexual activity: Yes    Birth control/protection: None  Other Topics Concern   Not on file  Social History Narrative   Lives alone.    Social Determinants of Health   Financial Resource Strain: Medium Risk (07/20/2023)   Overall Financial Resource Strain (CARDIA)    Difficulty of Paying Living Expenses: Somewhat hard  Food Insecurity: Patient Declined (07/20/2023)   Hunger Vital Sign    Worried About Running Out of Food in the Last Year: Patient declined    Ran Out of Food in the Last Year: Patient declined  Transportation Needs: No Transportation Needs (07/20/2023)   PRAPARE - Administrator, Civil Service (Medical): No  Lack of Transportation (Non-Medical): No  Physical Activity: Unknown (07/20/2023)   Exercise Vital Sign    Days of Exercise per Week: 0 days    Minutes of Exercise per Session: Not on file  Stress: Stress Concern Present (07/20/2023)   [employer] of Occupational Health - Occupational Stress Questionnaire    Feeling of Stress : Very much  Social Connections: Unknown (07/20/2023)   Social Connection and Isolation Panel [NHANES]    Frequency of Communication with Friends and Family: More than three times a week    Frequency of Social Gatherings with Friends and Family: Patient declined    Attends Religious Services: Patient declined    Database administrator or Organizations: No    Attends Engineer, structural: Not on file    Marital Status: Patient declined  Catering manager  Violence: Not on file    Outpatient Medications Prior to Visit  Medication Sig Dispense Refill   albuterol (VENTOLIN HFA) 108 (90 Base) MCG/ACT inhaler Inhale 1 puff into the lungs every 6 (six) hours as needed for wheezing or shortness of breath. 1 each 3   buPROPion (WELLBUTRIN XL) 150 MG 24 hr tablet Take 1 tablet (150 mg total) by mouth every morning. 30 tablet 3   HYDROcodone-acetaminophen (NORCO) 10-325 MG tablet Take by oral route for 15 days.     HYDROcodone-acetaminophen (NORCO) 7.5-325 MG tablet hydrocodone 7.5 mg-acetaminophen 325 mg tablet  Take 1 tablet twice a day by oral route as needed for 7 days.     hydrOXYzine (VISTARIL) 25 MG capsule Take 1 capsule (25 mg total) by mouth every 8 (eight) hours as needed. 30 capsule 0   metFORMIN (GLUCOPHAGE) 500 MG tablet Take 1 tablet (500 mg total) by mouth daily with breakfast. 90 tablet 1   methocarbamol (ROBAXIN) 500 MG tablet Take 1 tablet (500 mg total) by mouth every 8 (eight) hours as needed for muscle spasms. 30 tablet 2   PARoxetine (PAXIL) 10 MG tablet Take 1 tablet (10 mg total) by mouth at bedtime. 30 tablet 3   No facility-administered medications prior to visit.    Allergies  Allergen Reactions   Penicillins Hives, Rash and Other (See Comments)    Has patient had a PCN reaction causing immediate rash, facial/tongue/throat swelling, SOB or lightheadedness with hypotension: unkn Has patient had a PCN reaction causing severe rash involving mucus membranes or skin necrosis: unkn PATIENT HAS HAD A PCN REACTION THAT REQUIRED HOSPITALIZATION:  #  #  YES  #  #  Has patient had a PCN reaction occurring within the last 10 years: #  #  #  YES  #  #  #  If all of the above answers are "NO", then may proceed with Cephalosporin use.     Review of Systems  Constitutional:  Negative for chills and fever.  HENT:  Positive for congestion, postnasal drip, sinus pressure and sore throat.   Eyes:  Negative for pain and discharge.   Respiratory:  Positive for cough. Negative for shortness of breath.   Cardiovascular:  Negative for chest pain and palpitations.  Gastrointestinal:  Negative for abdominal pain, diarrhea, nausea and vomiting.  Endocrine: Negative for polydipsia and polyuria.  Genitourinary:  Negative for dysuria and hematuria.  Musculoskeletal:  Negative for neck pain and neck stiffness.  Skin:  Negative for rash.  Neurological:  Negative for dizziness and weakness.  Psychiatric/Behavioral:  Negative for agitation and behavioral problems.        Objective:  Physical Exam Vitals reviewed.  Constitutional:      General: She is not in acute distress.    Appearance: She is obese. She is not diaphoretic.  HENT:     Head: Normocephalic and atraumatic.     Right Ear: There is no impacted cerumen.     Left Ear: There is no impacted cerumen.     Nose: Congestion present.     Right Sinus: Frontal sinus tenderness present.     Left Sinus: Frontal sinus tenderness present.     Mouth/Throat:     Mouth: Mucous membranes are moist.     Pharynx: Posterior oropharyngeal erythema present.  Eyes:     General: No scleral icterus.    Extraocular Movements: Extraocular movements intact.  Cardiovascular:     Rate and Rhythm: Normal rate and regular rhythm.     Heart sounds: Normal heart sounds. No murmur heard. Pulmonary:     Breath sounds: Normal breath sounds. No wheezing or rales.  Musculoskeletal:     Cervical back: Neck supple. No tenderness.     Right lower leg: No edema.     Left lower leg: No edema.  Skin:    General: Skin is warm.     Findings: No rash.  Neurological:     General: No focal deficit present.     Mental Status: She is alert and oriented to person, place, and time.  Psychiatric:        Mood and Affect: Mood normal.        Behavior: Behavior normal.     BP (!) 144/84 (BP Location: Left Arm)   Pulse 86   Ht 5\' 2"  (1.575 m)   Wt 215 lb 3.2 oz (97.6 kg)   SpO2 98%   BMI 39.36  kg/m  Wt Readings from Last 3 Encounters:  11/01/23 215 lb 3.2 oz (97.6 kg)  07/21/23 216 lb (98 kg)  02/25/22 204 lb (92.5 kg)        Assessment & Plan:   Problem List Items Addressed This Visit       Respiratory   Acute non-recurrent frontal sinusitis - Primary    Started empiric azithromycin as she has persistent symptoms despite symptomatic treatment Flonase for nasal congestion Avoid Sudafed for more than 3 consecutive days Use humidifier and/or vaporizer to avoid dry air exposure and for nasal congestion      Relevant Medications   azithromycin (ZITHROMAX) 250 MG tablet   fluticasone (FLONASE) 50 MCG/ACT nasal spray     Other   Elevated blood pressure reading    BP Readings from Last 1 Encounters:  11/01/23 (!) 144/84   Uncontrolled Could be due to decongestant and h/o smoking Advised to f/u with PCP after 2 weeks Advised DASH diet and moderate exercise/walking, at least 150 mins/week         Meds ordered this encounter  Medications   azithromycin (ZITHROMAX) 250 MG tablet    Sig: Take 2 tablets on day 1, then 1 tablet daily on days 2 through 5    Dispense:  6 tablet    Refill:  0   fluticasone (FLONASE) 50 MCG/ACT nasal spray    Sig: Place 2 sprays into both nostrils daily.    Dispense:  16 g    Refill:  1     Nonnie Pickney Concha Se, MD

## 2023-11-01 NOTE — Patient Instructions (Addendum)
Please start taking Azithromycin as prescribed.  Use humidifier and/or vaporizer as needed for nasal stuffiness.  Use Flonase for nasal congestion.

## 2023-11-15 ENCOUNTER — Ambulatory Visit: Payer: Medicaid Other | Admitting: Family Medicine

## 2023-12-02 ENCOUNTER — Other Ambulatory Visit: Payer: Self-pay | Admitting: Family Medicine

## 2023-12-23 IMAGING — DX DG CHEST 1V PORT
1 series · 1 of 1 positions shown · non-contrast
Comparison: Chest x-ray dated September 11, 2021

CLINICAL DATA: Chest pain

EXAM:
PORTABLE CHEST 1 VIEW

[chest ap]
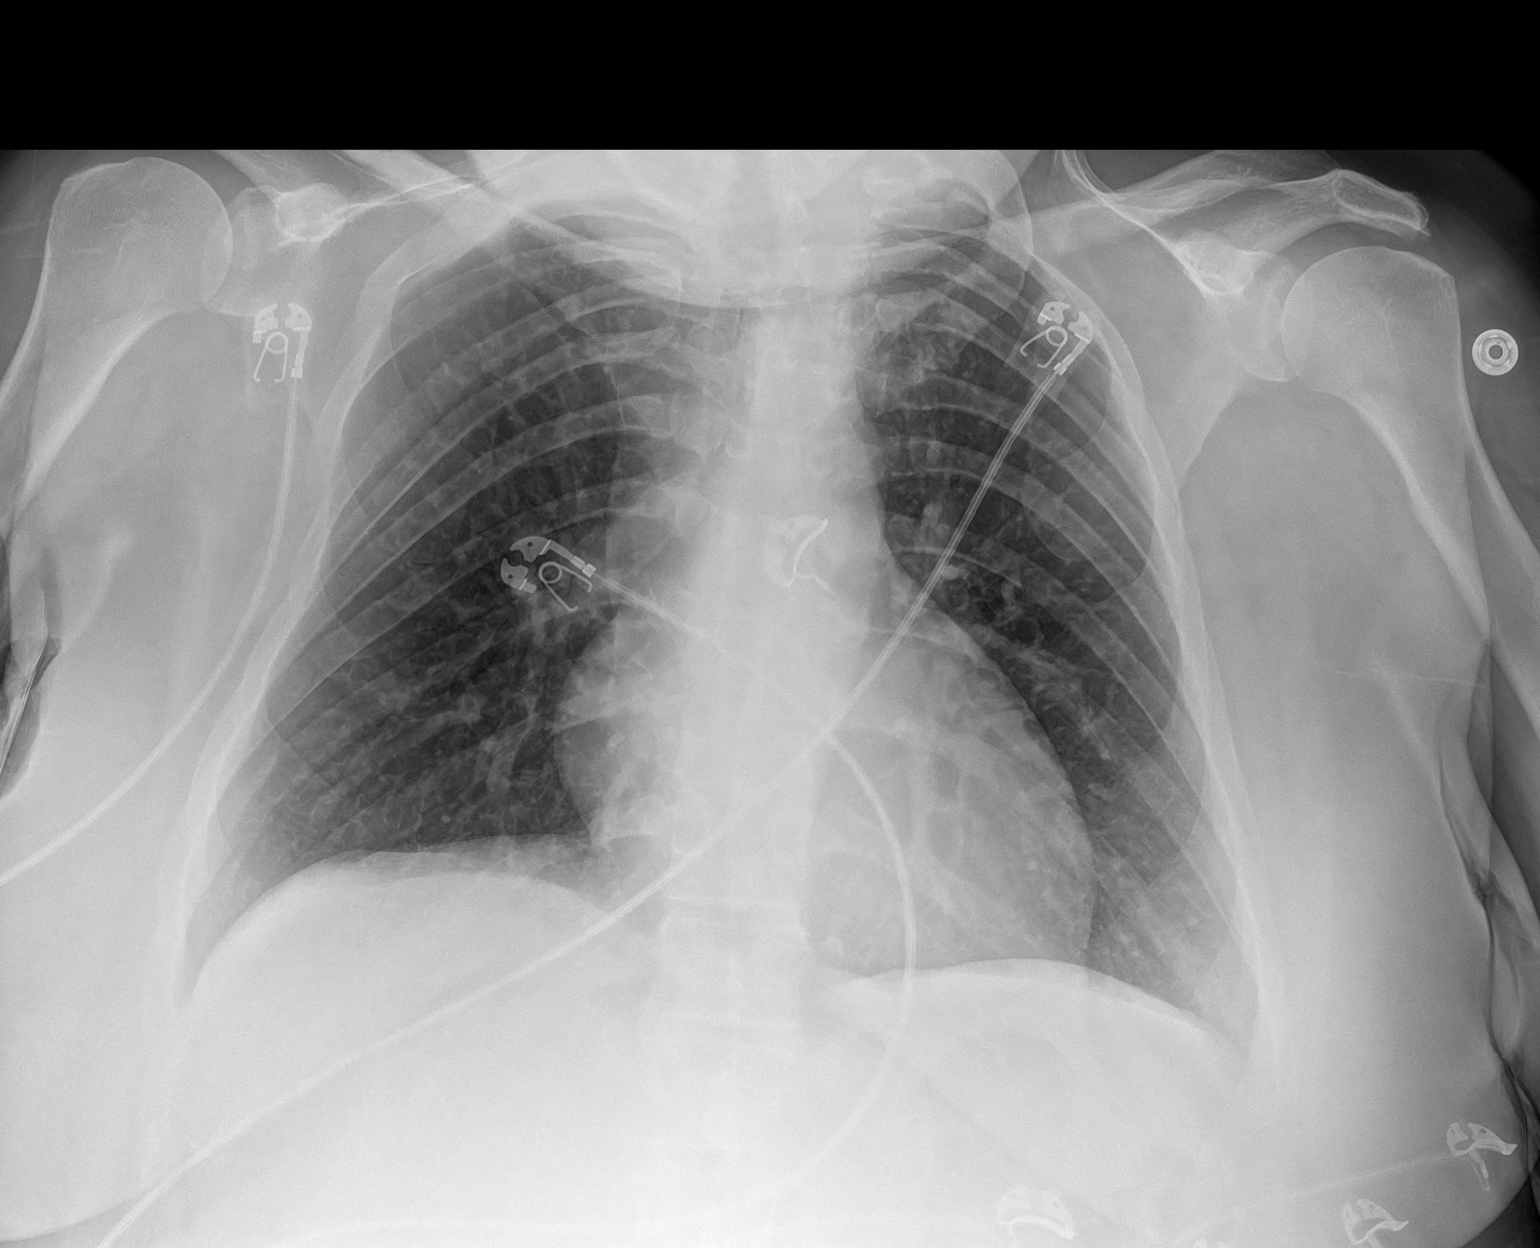

[1 of 1 positions shown; findings below may reference images not displayed]

FINDINGS: The heart size and mediastinal contours are within normal limits.
Both lungs are clear. The visualized skeletal structures are
unremarkable.
IMPRESSION: No active disease.

## 2024-01-20 ENCOUNTER — Ambulatory Visit: Payer: Self-pay | Admitting: Family Medicine

## 2024-02-09 ENCOUNTER — Other Ambulatory Visit: Payer: Self-pay | Admitting: Family Medicine

## 2024-02-09 ENCOUNTER — Encounter: Payer: Self-pay | Admitting: Family Medicine

## 2024-02-09 ENCOUNTER — Ambulatory Visit: Payer: Self-pay | Admitting: Internal Medicine

## 2024-02-09 ENCOUNTER — Telehealth: Payer: Self-pay | Admitting: Family Medicine

## 2024-02-09 VITALS — BP 146/98 | HR 89 | Ht 62.5 in | Wt 211.6 lb

## 2024-02-09 DIAGNOSIS — E119 Type 2 diabetes mellitus without complications: Secondary | ICD-10-CM | POA: Insufficient documentation

## 2024-02-09 DIAGNOSIS — R590 Localized enlarged lymph nodes: Secondary | ICD-10-CM | POA: Diagnosis not present

## 2024-02-09 DIAGNOSIS — I10 Essential (primary) hypertension: Secondary | ICD-10-CM

## 2024-02-09 DIAGNOSIS — R252 Cramp and spasm: Secondary | ICD-10-CM

## 2024-02-09 DIAGNOSIS — E669 Obesity, unspecified: Secondary | ICD-10-CM

## 2024-02-09 DIAGNOSIS — K219 Gastro-esophageal reflux disease without esophagitis: Secondary | ICD-10-CM | POA: Diagnosis not present

## 2024-02-09 DIAGNOSIS — F411 Generalized anxiety disorder: Secondary | ICD-10-CM | POA: Diagnosis not present

## 2024-02-09 DIAGNOSIS — F419 Anxiety disorder, unspecified: Secondary | ICD-10-CM

## 2024-02-09 DIAGNOSIS — E1169 Type 2 diabetes mellitus with other specified complication: Secondary | ICD-10-CM | POA: Insufficient documentation

## 2024-02-09 MED ORDER — HYDROXYZINE PAMOATE 25 MG PO CAPS
25.0000 mg | ORAL_CAPSULE | Freq: Three times a day (TID) | ORAL | 0 refills | Status: DC | PRN
Start: 2024-02-09 — End: 2024-07-25

## 2024-02-09 MED ORDER — OMEPRAZOLE 40 MG PO CPDR: 40.0000 mg | DELAYED_RELEASE_CAPSULE | Freq: Every day | ORAL | 3 refills | Status: DC

## 2024-02-09 MED ORDER — OLMESARTAN MEDOXOMIL 20 MG PO TABS: 10.0000 mg | ORAL_TABLET | Freq: Every day | ORAL | 1 refills | Status: DC

## 2024-02-09 MED ORDER — METHOCARBAMOL 500 MG PO TABS: 500.0000 mg | ORAL_TABLET | Freq: Three times a day (TID) | ORAL | 2 refills | Status: DC | PRN

## 2024-02-09 MED ORDER — OZEMPIC (0.25 OR 0.5 MG/DOSE) 2 MG/3ML ~~LOC~~ SOPN: PEN_INJECTOR | SUBCUTANEOUS | 1 refills | Status: AC

## 2024-02-09 NOTE — Telephone Encounter (Signed)
 sent

## 2024-02-09 NOTE — Progress Notes (Signed)
 Established Patient Office Visit  Subjective:  Patient ID: Amanda Morales, female    DOB: [birth year]  Age: 38 y.o. MRN: [id number]  CC:  Chief Complaint  Patient presents with   Adenopathy    Swollen lymph nodes    Gastroesophageal Reflux    Acid reflux, no otc is helping     HPI Amanda Morales is a 38 y.o. female with past medical history of HTN, type II DM, GERD and GAD who presents for f/u of her chronic medical conditions.  She reports having intermittent bumps in her neck, and attributes it to swollen lymph nodes.  She has noticed soreness in the area.  She has history of recurrent sinusitis.  Denies sore throat, dysphagia or dyspnea currently.  She did not have any palpable bumps on physical exam today.  GERD: She reports heartburn, which is chronic.  She has tried taking OTC antihistaminics, including Pepcid, Zantac, etc. without much relief.  She reports epigastric discomfort, but denies nausea or vomiting.  HTN: Her BP was significantly elevated today upon multiple measurements.  She has generalized headache, but denies any chest pain, dyspnea or palpitations currently.  Type II DM: She was given metformin, but did not tolerate it due to GI side effects. Her HbA1c was 6.6 in 08/24. She checks her blood glucose at times, which have been around 120-140 mostly before meals.  She has chronic fatigue, but denies polyuria or polydipsia currently.  GAD: She takes Wellbutrin 150 mg QD, Paxil 10 mg QD and hydroxyzine as needed for anxiety.  Past Medical History:  Diagnosis Date   Anxiety    Asthma    as a child   Depression    GERD (gastroesophageal reflux disease)     Past Surgical History:  Procedure Laterality Date   BACK SURGERY     LUMBAR LAMINECTOMY/DECOMPRESSION MICRODISCECTOMY N/A 08/04/2018   Procedure: Microlumbar decompression Lumbar three-four, Revision Lumbar four-five;  Surgeon: Jene Every, MD;  Location: [facility] OR;  Service: Orthopedics;  Laterality: N/A;    TONSILLECTOMY     WISDOM TOOTH EXTRACTION      Family History  Problem Relation Age of Onset   Anxiety disorder Mother    Diabetes type II Mother    Diabetes Mellitus II Father    Hypertension Father    Anxiety disorder Brother    Lupus Maternal Aunt    Anxiety disorder Maternal Grandmother    Depression Maternal Grandmother    Cervical cancer Maternal Grandmother    COPD Maternal Grandfather    Liver cancer Maternal Grandfather    Melanoma Maternal Grandfather    Anxiety disorder Paternal Grandmother    Depression Paternal Grandmother        cancer on his lymph node   Breast cancer Neg Hx     Social History   Socioeconomic History   Marital status: Single    Spouse name: Not on file   Number of children: Not on file   Years of education: Not on file   Highest education level: 9th grade  Occupational History   Not on file  Tobacco Use   Smoking status: Every Day    Current packs/day: 0.50    Average packs/day: 0.5 packs/day for 15.0 years (7.5 ttl pk-yrs)    Types: Cigarettes   Smokeless tobacco: Never   Tobacco comments:    She smokes 6-7 cigarettes daily.   Vaping Use   Vaping status: Not on file  Substance and Sexual Activity   Alcohol use:  Yes    Comment: occ   Drug use: Yes    Types: Marijuana    Comment: 2 joints a week   Sexual activity: Yes    Birth control/protection: None  Other Topics Concern   Not on file  Social History Narrative   Lives alone.    Social Drivers of Health   Financial Resource Strain: Medium Risk (02/09/2024)   Overall Financial Resource Strain (CARDIA)    Difficulty of Paying Living Expenses: Somewhat hard  Food Insecurity: Food Insecurity Present (02/09/2024)   Hunger Vital Sign    Worried About Running Out of Food in the Last Year: Sometimes true    Ran Out of Food in the Last Year: Sometimes true  Transportation Needs: No Transportation Needs (02/09/2024)   PRAPARE - Administrator, Civil Service (Medical): No     Lack of Transportation (Non-Medical): No  Physical Activity: Unknown (02/09/2024)   Exercise Vital Sign    Days of Exercise per Week: 0 days    Minutes of Exercise per Session: Not on file  Stress: Stress Concern Present (02/09/2024)   [employer] of Occupational Health - Occupational Stress Questionnaire    Feeling of Stress : Very much  Social Connections: Unknown (02/09/2024)   Social Connection and Isolation Panel [NHANES]    Frequency of Communication with Friends and Family: More than three times a week    Frequency of Social Gatherings with Friends and Family: Patient declined    Attends Religious Services: Never    Database administrator or Organizations: No    Attends Engineer, structural: Not on file    Marital Status: Patient declined  Catering manager Violence: Not on file    Outpatient Medications Prior to Visit  Medication Sig Dispense Refill   albuterol (VENTOLIN HFA) 108 (90 Base) MCG/ACT inhaler Inhale 1 puff into the lungs every 6 (six) hours as needed for wheezing or shortness of breath. 1 each 3   buPROPion (WELLBUTRIN XL) 150 MG 24 hr tablet TAKE 1 TABLET(150 MG) BY MOUTH EVERY MORNING 90 tablet 0   fluticasone (FLONASE) 50 MCG/ACT nasal spray Place 2 sprays into both nostrils daily. 16 g 1   HYDROcodone-acetaminophen (NORCO) 10-325 MG tablet Take by oral route for 15 days.     HYDROcodone-acetaminophen (NORCO) 7.5-325 MG tablet hydrocodone 7.5 mg-acetaminophen 325 mg tablet  Take 1 tablet twice a day by oral route as needed for 7 days.     hydrOXYzine (VISTARIL) 25 MG capsule Take 1 capsule (25 mg total) by mouth every 8 (eight) hours as needed. 30 capsule 0   PARoxetine (PAXIL) 10 MG tablet Take 1 tablet (10 mg total) by mouth at bedtime. 30 tablet 3   metFORMIN (GLUCOPHAGE) 500 MG tablet Take 1 tablet (500 mg total) by mouth daily with breakfast. 90 tablet 1   methocarbamol (ROBAXIN) 500 MG tablet Take 1 tablet (500 mg total) by mouth every 8 (eight)  hours as needed for muscle spasms. 30 tablet 2   No facility-administered medications prior to visit.    Allergies  Allergen Reactions   Penicillins Hives, Rash and Other (See Comments)    Has patient had a PCN reaction causing immediate rash, facial/tongue/throat swelling, SOB or lightheadedness with hypotension: unkn Has patient had a PCN reaction causing severe rash involving mucus membranes or skin necrosis: unkn PATIENT HAS HAD A PCN REACTION THAT REQUIRED HOSPITALIZATION:  #  #  YES  #  #  Has patient had a  PCN reaction occurring within the last 10 years: #  #  #  YES  #  #  #  If all of the above answers are "NO", then may proceed with Cephalosporin use.     ROS Review of Systems  Constitutional:  Negative for chills and fever.  HENT:  Negative for congestion, sinus pressure, sinus pain and sore throat.   Eyes:  Negative for pain and discharge.  Respiratory:  Negative for cough and shortness of breath.   Cardiovascular:  Negative for chest pain and palpitations.  Gastrointestinal:  Negative for abdominal pain, diarrhea, nausea and vomiting.  Endocrine: Negative for polydipsia and polyuria.  Genitourinary:  Negative for dysuria and hematuria.  Musculoskeletal:  Negative for neck pain and neck stiffness.  Skin:  Negative for rash.  Neurological:  Negative for dizziness and weakness.  Psychiatric/Behavioral:  Negative for agitation and behavioral problems.       Objective:    Physical Exam Vitals reviewed.  Constitutional:      General: She is not in acute distress.    Appearance: She is obese. She is not diaphoretic.  HENT:     Head: Normocephalic and atraumatic.     Nose: Nose normal. No congestion.     Mouth/Throat:     Mouth: Mucous membranes are moist.     Pharynx: No posterior oropharyngeal erythema.  Eyes:     General: No scleral icterus.    Extraocular Movements: Extraocular movements intact.  Neck:     Comments: No cervical adenopathy  noted Cardiovascular:     Rate and Rhythm: Normal rate and regular rhythm.     Heart sounds: No murmur heard. Pulmonary:     Breath sounds: Normal breath sounds. No wheezing or rales.  Abdominal:     Palpations: Abdomen is soft.     Tenderness: There is no abdominal tenderness.  Musculoskeletal:     Cervical back: Neck supple. No tenderness.     Right lower leg: No edema.     Left lower leg: No edema.  Skin:    General: Skin is warm.     Findings: No rash.  Neurological:     General: No focal deficit present.     Mental Status: She is alert and oriented to person, place, and time.  Psychiatric:        Mood and Affect: Mood normal.        Behavior: Behavior normal.     BP (!) 146/98 (BP Location: Left Arm)   Pulse 89   Ht 5' 2.5" (1.588 m)   Wt 211 lb 9.6 oz (96 kg)   SpO2 98%   BMI 38.09 kg/m  Wt Readings from Last 3 Encounters:  02/09/24 211 lb 9.6 oz (96 kg)  11/01/23 215 lb 3.2 oz (97.6 kg)  07/21/23 216 lb (98 kg)    Lab Results  Component Value Date   TSH 2.660 07/21/2023   Lab Results  Component Value Date   WBC 9.3 07/21/2023   HGB 15.4 07/21/2023   HCT 44.3 07/21/2023   MCV 96 07/21/2023   PLT 210 07/21/2023   Lab Results  Component Value Date   NA 139 07/21/2023   K 4.0 07/21/2023   CO2 19 (L) 07/21/2023   GLUCOSE 131 (H) 07/21/2023   BUN 11 07/21/2023   CREATININE 0.74 07/21/2023   BILITOT 0.5 07/21/2023   ALKPHOS 54 07/21/2023   AST 37 07/21/2023   ALT 46 (H) 07/21/2023   PROT 7.3 07/21/2023  ALBUMIN 4.8 07/21/2023   CALCIUM 9.6 07/21/2023   ANIONGAP 8 12/16/2021   EGFR 106 07/21/2023   Lab Results  Component Value Date   CHOL 156 07/21/2023   Lab Results  Component Value Date   HDL 45 07/21/2023   Lab Results  Component Value Date   LDLCALC 85 07/21/2023   Lab Results  Component Value Date   TRIG 147 07/21/2023   Lab Results  Component Value Date   CHOLHDL 3.5 07/21/2023   Lab Results  Component Value Date   HGBA1C  6.6 (H) 07/21/2023      Assessment & Plan:   Problem List Items Addressed This Visit       Cardiovascular and Mediastinum   Essential hypertension   BP Readings from Last 1 Encounters:  02/09/24 (!) 146/98   Uncontrolled Started olmesartan 10 mg once daily Check CMP after 2 weeks Counseled for compliance with the medications Advised DASH diet and moderate exercise/walking, at least 150 mins/week       Relevant Medications   olmesartan (BENICAR) 20 MG tablet     Digestive   Gastroesophageal reflux disease - Primary   Epigastric discomfort likely due to GERD She has tried OTC antacids such as Pepcid, Zantac without much relief Started omeprazole 40 mg once daily Avoid hot, spicy food and soft drinks      Relevant Medications   omeprazole (PRILOSEC) 40 MG capsule     Endocrine   Type 2 diabetes mellitus with obesity (HCC)   Lab Results  Component Value Date   HGBA1C 6.6 (H) 07/21/2023   Uncontrolled due to noncompliance, did not tolerate metformin Started Ozempic 0.25 mg qw, plan to increase dose as tolerated Advised to follow diabetic diet On ARB now F/u CMP and lipid panel Diabetic eye exam: Advised to follow up with Ophthalmology for diabetic eye exam      Relevant Medications   olmesartan (BENICAR) 20 MG tablet   Semaglutide,0.25 or 0.5MG /DOS, (OZEMPIC, 0.25 OR 0.5 MG/DOSE,) 2 MG/3ML SOPN   Other Relevant Orders   CMP14+EGFR   Hemoglobin A1c     Immune and Lymphatic   Anterior cervical adenopathy   Unclear if she had cervical adenopathy versus salivary gland enlargement If recurrent, will get [place] of neck        Other   GAD (generalized anxiety disorder)   Currently manageable with Wellbutrin 150 mg QD and Paxil 10 mg nightly Has hydroxyzine as needed for spells of anxiety Can benefit from relaxation exercises       Meds ordered this encounter  Medications   omeprazole (PRILOSEC) 40 MG capsule    Sig: Take 1 capsule (40 mg total) by mouth  daily.    Dispense:  30 capsule    Refill:  3   olmesartan (BENICAR) 20 MG tablet    Sig: Take 0.5 tablets (10 mg total) by mouth daily.    Dispense:  30 tablet    Refill:  1   Semaglutide,0.25 or 0.5MG /DOS, (OZEMPIC, 0.25 OR 0.5 MG/DOSE,) 2 MG/3ML SOPN    Sig: Inject 0.25 mg into the skin every 7 (seven) days for 28 days, THEN 0.5 mg every 7 (seven) days for 28 days.    Dispense:  3 mL    Refill:  1    Follow-up: Return in about 3 weeks (around 03/01/2024) for HTN and DM.    Anabel Halon, MD

## 2024-02-09 NOTE — Telephone Encounter (Signed)
 Patient saw Dr Allena Katz in office today and is wanting to switch over to him- says he was very thorough with her and she appreciated it. Please advise on scheduling  Thank you

## 2024-02-09 NOTE — Patient Instructions (Addendum)
 Please start taking Omeprazole as prescribed for acid reflux.  Please start taking Olmesartan as prescribed.  Please continue to take medications as prescribed.  Please continue to follow low carb diet and perform moderate exercise/walking at least 150 mins/week.  Please get fasting blood tests done after 2 weeks.

## 2024-02-09 NOTE — Telephone Encounter (Signed)
 That's fine

## 2024-02-10 ENCOUNTER — Telehealth: Payer: Self-pay | Admitting: Pharmacy Technician

## 2024-02-10 ENCOUNTER — Other Ambulatory Visit (HOSPITAL_COMMUNITY): Payer: Self-pay

## 2024-02-10 NOTE — Telephone Encounter (Signed)
 Pharmacy Patient Advocate Encounter   Received notification from Onbase that prior authorization for OZEMPIC 0.25MG  OR 0.5MG /DOSE is required/requested.   Insurance verification completed.   The patient is insured through [facility] MEDICAID .   Per test claim: PA required; PA submitted to above mentioned insurance via CoverMyMeds Key/confirmation #/EOC [id number] Status is pending

## 2024-02-10 NOTE — Telephone Encounter (Signed)
 Pharmacy Patient Advocate Encounter  Received notification from [facility] MEDICAID that Prior Authorization for [facility] 0.25MG  OR 0.5MG /DOSE has been APPROVED from 02/10/2024 to 02/09/2025. Ran test claim, Copay is $4.00. This test claim was processed through [facility]- copay amounts may vary at other pharmacies due to pharmacy/plan contracts, or as the patient moves through the different stages of their insurance plan.   PA #/Case ID/Reference #: [id number]

## 2024-02-11 NOTE — Assessment & Plan Note (Addendum)
 Epigastric discomfort likely due to GERD She has tried OTC antacids such as Pepcid, Zantac without much relief Started omeprazole 40 mg once daily Avoid hot, spicy food and soft drinks

## 2024-02-11 NOTE — Assessment & Plan Note (Signed)
 Unclear if she had cervical adenopathy versus salivary gland enlargement If recurrent, will get [place] of neck

## 2024-02-11 NOTE — Assessment & Plan Note (Signed)
 Currently manageable with Wellbutrin 150 mg QD and Paxil 10 mg nightly Has hydroxyzine as needed for spells of anxiety Can benefit from relaxation exercises

## 2024-02-11 NOTE — Assessment & Plan Note (Signed)
 Lab Results  Component Value Date   HGBA1C 6.6 (H) 07/21/2023   Uncontrolled due to noncompliance, did not tolerate metformin Started Ozempic 0.25 mg qw, plan to increase dose as tolerated Advised to follow diabetic diet On ARB now F/u CMP and lipid panel Diabetic eye exam: Advised to follow up with Ophthalmology for diabetic eye exam

## 2024-02-11 NOTE — Assessment & Plan Note (Signed)
 BP Readings from Last 1 Encounters:  02/09/24 (!) 146/98   Uncontrolled Started olmesartan 10 mg once daily Check CMP after 2 weeks Counseled for compliance with the medications Advised DASH diet and moderate exercise/walking, at least 150 mins/week

## 2024-02-15 NOTE — Telephone Encounter (Signed)
Attempted to reach patient vm full.

## 2024-02-17 ENCOUNTER — Encounter: Payer: Self-pay | Admitting: Internal Medicine

## 2024-03-09 ENCOUNTER — Other Ambulatory Visit: Payer: Self-pay | Admitting: Family Medicine

## 2024-04-24 DIAGNOSIS — H5213 Myopia, bilateral: Secondary | ICD-10-CM | POA: Diagnosis not present

## 2024-04-25 DIAGNOSIS — H5213 Myopia, bilateral: Secondary | ICD-10-CM | POA: Diagnosis not present

## 2024-04-30 ENCOUNTER — Other Ambulatory Visit: Payer: Self-pay | Admitting: Family Medicine

## 2024-04-30 ENCOUNTER — Other Ambulatory Visit: Payer: Self-pay | Admitting: Internal Medicine

## 2024-04-30 DIAGNOSIS — E1169 Type 2 diabetes mellitus with other specified complication: Secondary | ICD-10-CM

## 2024-04-30 DIAGNOSIS — R252 Cramp and spasm: Secondary | ICD-10-CM

## 2024-05-02 ENCOUNTER — Other Ambulatory Visit: Payer: Self-pay | Admitting: Family Medicine

## 2024-05-02 ENCOUNTER — Encounter: Payer: Self-pay | Admitting: Family Medicine

## 2024-05-02 ENCOUNTER — Encounter: Payer: Self-pay | Admitting: Internal Medicine

## 2024-05-02 MED ORDER — SEMAGLUTIDE (1 MG/DOSE) 4 MG/3ML ~~LOC~~ SOPN: 1.0000 mg | PEN_INJECTOR | SUBCUTANEOUS | 0 refills | Status: DC

## 2024-05-03 NOTE — Telephone Encounter (Signed)
 I sent this yestrday

## 2024-05-24 ENCOUNTER — Telehealth: Payer: Self-pay | Admitting: Family Medicine

## 2024-05-24 NOTE — Telephone Encounter (Signed)
 Called patient no answer, needs to schedule a diabetic eye exam

## 2024-05-30 ENCOUNTER — Other Ambulatory Visit: Payer: Self-pay | Admitting: Family Medicine

## 2024-05-30 MED ORDER — SEMAGLUTIDE (2 MG/DOSE) 8 MG/3ML ~~LOC~~ SOPN: 2.0000 mg | PEN_INJECTOR | SUBCUTANEOUS | 2 refills | Status: DC

## 2024-06-03 ENCOUNTER — Other Ambulatory Visit: Payer: Self-pay | Admitting: Internal Medicine

## 2024-06-03 DIAGNOSIS — I10 Essential (primary) hypertension: Secondary | ICD-10-CM

## 2024-06-21 ENCOUNTER — Other Ambulatory Visit: Payer: Self-pay | Admitting: Family Medicine

## 2024-07-11 ENCOUNTER — Ambulatory Visit: Payer: Self-pay

## 2024-07-11 VITALS — BP 120/81 | HR 88 | Ht 62.0 in | Wt 186.1 lb

## 2024-07-11 DIAGNOSIS — N6323 Unspecified lump in the left breast, lower outer quadrant: Secondary | ICD-10-CM | POA: Diagnosis not present

## 2024-07-11 NOTE — Progress Notes (Signed)
 Established Patient Office Visit  Subjective   Patient ID: Amanda Morales, female    DOB: [birth year]  Age: 38 y.o. MRN: [id number]  Chief Complaint  Patient presents with   Medical Management of Chronic Issues    Lump under left breast     HPI  Breast Mass: Patient presents for evaluation of a breast mass. Change was noted 2 days ago. Patient does routinely do self breast exams.  Patient has noted a change on breast exam. Breast cancer risk factors include family hx on mother's side. Patient denies nipple discharge. Patient denies to previous breast biopsy. Patient denies a personal history of breast cancer.    Patient Active Problem List   Diagnosis Date Noted   Gastroesophageal reflux disease 02/09/2024   Anterior cervical adenopathy 02/09/2024   Type 2 diabetes mellitus with obesity (HCC) 02/09/2024   Essential hypertension 02/09/2024   Acute non-recurrent frontal sinusitis 11/01/2023   Leg pain 07/21/2023   Elevated blood pressure reading 07/21/2023   Lumbar radiculopathy, chronic 02/25/2022   GAD (generalized anxiety disorder) 02/25/2022   Current every day smoker 02/25/2022   HNP (herniated nucleus pulposus), lumbar 08/04/2018   Spinal stenosis at L4-L5 level 08/04/2018      ROS    Objective:     BP 120/81   Pulse 88   Ht 5' 2 (1.575 m)   Wt 186 lb 1.9 oz (84.4 kg)   SpO2 97%   BMI 34.04 kg/m  BP Readings from Last 3 Encounters:  07/11/24 120/81  02/09/24 (!) 146/98  11/01/23 (!) 144/84   Wt Readings from Last 3 Encounters:  07/11/24 186 lb 1.9 oz (84.4 kg)  02/09/24 211 lb 9.6 oz (96 kg)  11/01/23 215 lb 3.2 oz (97.6 kg)      Physical Exam Vitals and nursing note reviewed.  Constitutional:      Appearance: Normal appearance.  HENT:     Head: Normocephalic.  Eyes:     Extraocular Movements: Extraocular movements intact.     Pupils: Pupils are equal, round, and reactive to light.  Chest:  Breasts:    Breasts are symmetrical.     Right:  Normal.     Left: Mass (6 o'clock, ill-determined mass, non-tender) present. No swelling, inverted nipple, nipple discharge, skin change or tenderness.  Musculoskeletal:     Cervical back: Normal range of motion and neck supple.  Lymphadenopathy:     Upper Body:     Right upper body: No supraclavicular or axillary adenopathy.     Left upper body: No supraclavicular or axillary adenopathy.  Neurological:     Mental Status: She is alert and oriented to person, place, and time.  Psychiatric:        Mood and Affect: Mood normal.        Thought Content: Thought content normal.      No results found for any visits on 07/11/24.    The ASCVD Risk score (Arnett DK, et al., 2019) failed to calculate for the following reasons:   The 2019 ASCVD risk score is only valid for ages 32 to 58    Assessment & Plan:   Problem List Items Addressed This Visit   None Visit Diagnoses       Mass of lower outer quadrant of left breast    -  Primary   will obtain diagnostic mammogram for further evaluation of mass.   Relevant Orders   MM 3D DIAGNOSTIC MAMMOGRAM BILATERAL BREAST  No follow-ups on file.    Leita Longs, FNP

## 2024-07-18 ENCOUNTER — Telehealth: Payer: Self-pay | Admitting: Family Medicine

## 2024-07-18 ENCOUNTER — Other Ambulatory Visit (HOSPITAL_COMMUNITY): Payer: Self-pay

## 2024-07-18 DIAGNOSIS — N63 Unspecified lump in unspecified breast: Secondary | ICD-10-CM

## 2024-07-18 NOTE — Telephone Encounter (Signed)
 Copied from CRM [id number]. Topic: General - Other >> Jul 18, 2024 12:15 PM Turkey B wrote: Reason for CRM: patient called in about where she is to go to get her mammogram. [facility]? Please cb to confirm

## 2024-07-18 NOTE — Telephone Encounter (Signed)
 Provided pt with phone number to schedule mammogram

## 2024-07-25 ENCOUNTER — Other Ambulatory Visit: Payer: Self-pay | Admitting: Family Medicine

## 2024-07-25 DIAGNOSIS — F419 Anxiety disorder, unspecified: Secondary | ICD-10-CM

## 2024-08-01 ENCOUNTER — Ambulatory Visit (HOSPITAL_COMMUNITY)
Admission: RE | Admit: 2024-08-01 | Discharge: 2024-08-01 | Disposition: A | Discharge status: Home / Self Care | Source: Ambulatory Visit

## 2024-08-01 DIAGNOSIS — N63 Unspecified lump in unspecified breast: Secondary | ICD-10-CM | POA: Diagnosis present

## 2024-08-01 DIAGNOSIS — R92323 Mammographic fibroglandular density, bilateral breasts: Secondary | ICD-10-CM | POA: Diagnosis not present

## 2024-08-01 DIAGNOSIS — N6323 Unspecified lump in the left breast, lower outer quadrant: Secondary | ICD-10-CM | POA: Insufficient documentation

## 2024-08-01 DIAGNOSIS — N6320 Unspecified lump in the left breast, unspecified quadrant: Secondary | ICD-10-CM | POA: Diagnosis not present

## 2024-08-21 ENCOUNTER — Other Ambulatory Visit: Payer: Self-pay | Admitting: Family Medicine

## 2024-09-06 ENCOUNTER — Other Ambulatory Visit: Payer: Self-pay | Admitting: Internal Medicine

## 2024-09-06 ENCOUNTER — Other Ambulatory Visit: Payer: Self-pay | Admitting: Family Medicine

## 2024-09-06 DIAGNOSIS — K219 Gastro-esophageal reflux disease without esophagitis: Secondary | ICD-10-CM

## 2024-09-06 DIAGNOSIS — F32A Depression, unspecified: Secondary | ICD-10-CM

## 2024-09-18 ENCOUNTER — Other Ambulatory Visit: Payer: Self-pay | Admitting: Family Medicine

## 2024-09-21 ENCOUNTER — Ambulatory Visit: Payer: Self-pay

## 2024-09-21 ENCOUNTER — Ambulatory Visit
Admission: EM | Admit: 2024-09-21 | Discharge: 2024-09-21 | Disposition: A | Discharge status: Home / Self Care | Attending: Nurse Practitioner | Admitting: Nurse Practitioner

## 2024-09-21 ENCOUNTER — Encounter: Payer: Self-pay | Admitting: Emergency Medicine

## 2024-09-21 DIAGNOSIS — R197 Diarrhea, unspecified: Secondary | ICD-10-CM

## 2024-09-21 DIAGNOSIS — R112 Nausea with vomiting, unspecified: Secondary | ICD-10-CM | POA: Diagnosis not present

## 2024-09-21 LAB — POC COVID19/FLU A&B COMBO
Covid Antigen, POC: NEGATIVE
Influenza A Antigen, POC: NEGATIVE
Influenza B Antigen, POC: NEGATIVE

## 2024-09-21 MED ORDER — ONDANSETRON 4 MG PO TBDP
4.0000 mg | ORAL_TABLET | Freq: Once | ORAL | Status: AC
  Administered 2024-09-21: 4 mg via ORAL

## 2024-09-21 NOTE — Telephone Encounter (Signed)
Called patient, vm not set up

## 2024-09-21 NOTE — Discharge Instructions (Addendum)
 As we discussed, your symptoms are consistent with a viral stomach bug.  COVID-19 and influenza testing is negative today.  We gave you Zofran  today to help with the nausea.  You can continue Zofran  at home every 8 hours to prevent vomiting.  Please push plenty of fluids at home. Symptoms should improve over the next few days.  If symptoms worsen and you develop severe abdominal pain or nausea/vomiting and are unable to keep fluids down, please seek care in the ER.

## 2024-09-21 NOTE — ED Triage Notes (Signed)
 Fever, fatigue, nausea , headache since last night.

## 2024-09-21 NOTE — Telephone Encounter (Signed)
 FYI Only or Action Required?:  Refused urgent evaluation Patient was last seen in primary care on 07/11/2024 by Bevely Doffing, FNP.  Called Nurse Triage reporting Vomiting.  Symptoms began yesterday.  Interventions attempted: OTC medications: nausea medicine.  Symptoms are: unchanged.  Triage Disposition: See HCP Within 4 Hours (Or PCP Triage)  Patient/caregiver understands and will follow disposition?: No   Copied from CRM [id number]. Topic: Clinical - Pink Word Triage >> Sep 21, 2024  8:28 AM Darshell M wrote: Dawne Word triggered transfer to Nurse Triage. See Triage Message for details. Uncontrolled vomiting yesterday and throughout the night.  [phone] Reason for Disposition  High-risk adult (e.g., diabetes mellitus, brain tumor, V-P shunt, hernia)  Answer Assessment - Initial Assessment Questions Additional info: Patient requesting same day sick visit. No appointments available with pcp or pcp practice location, advised urgent evaluation but she refuses, states she will wait and call back tomorrow if not any better.    1. VOMITING SEVERITY: How many times have you vomited in the past 24 hours?      Several. Vomiting throughout the night  2. ONSET: When did the vomiting begin?      09/20/24 3. FLUIDS: What fluids or food have you vomited up today? Have you been able to keep any fluids down?     Has not drank today  4. ABDOMEN PAIN: Are your having any abdomen pain? If Yes : How bad is it and what does it feel like? (e.g., crampy, dull, intermittent, constant)      Mild achy constant 5. DIARRHEA: Is there any diarrhea? If Yes, ask: How many times today?      Loose stool started yesterday  6. CONTACTS: Is there anyone else in the family with the same symptoms?      Did not disclose 7. CAUSE: What do you think is causing your vomiting?     Unsure that's why I want an appointment  8. HYDRATION STATUS: Any signs of dehydration? (e.g., dry mouth [not  only dry lips], too weak to stand) When did you last urinate?     At work today. Hasn't tried to eat or drink as of this time.  9. OTHER SYMPTOMS: Do you have any other symptoms? (e.g., fever, headache, vertigo, vomiting blood or coffee grounds, recent head injury)     Denies  10. PREGNANCY: Is there any chance you are pregnant? When was your last menstrual period?    Did not answer-patient rushing triage.  Protocols used: Vomiting-A-AH

## 2024-09-21 NOTE — Telephone Encounter (Signed)
   Copied from CRM [id number]. Topic: Clinical - Pink Word Triage >> Sep 21, 2024  8:28 AM Darshell M wrote: Dawne Word triggered transfer to Nurse Triage. See Triage Message for details. Uncontrolled vomiting yesterday and throughout the night.  [phone]

## 2024-09-21 NOTE — ED Notes (Signed)
 Patient trying to sip on Gatorade and water

## 2024-09-21 NOTE — ED Provider Notes (Signed)
 [facility]    CSN: [id number] Arrival date & time: 09/21/24  1208      History   Chief Complaint No chief complaint on file.   HPI Amanda Morales is a 38 y.o. female.   Patient presents today with 1 day history of bodyaches and chills, temperature Tmax 99.4 F, headache, abdominal pain, nausea, and 2 episodes of vomiting last night.  She also Dors is a couple episodes of diarrhea today as well as decreased appetite and fatigue.  No blood in the vomit or stool.  No cough, congestion, shortness of breath, chest pain, runny or stuffy nose, or sore throat.  No known sick contacts.  Works at a Insurance claims handler.  Has not taken anything for symptoms so far.    Past Medical History:  Diagnosis Date   Anxiety    Asthma    as a child   Depression    GERD (gastroesophageal reflux disease)     Patient Active Problem List   Diagnosis Date Noted   Gastroesophageal reflux disease 02/09/2024   Anterior cervical adenopathy 02/09/2024   Type 2 diabetes mellitus with obesity 02/09/2024   Essential hypertension 02/09/2024   Acute non-recurrent frontal sinusitis 11/01/2023   Leg pain 07/21/2023   Elevated blood pressure reading 07/21/2023   Lumbar radiculopathy, chronic 02/25/2022   GAD (generalized anxiety disorder) 02/25/2022   Current every day smoker 02/25/2022   HNP (herniated nucleus pulposus), lumbar 08/04/2018   Spinal stenosis at L4-L5 level 08/04/2018    Past Surgical History:  Procedure Laterality Date   BACK SURGERY     LUMBAR LAMINECTOMY/DECOMPRESSION MICRODISCECTOMY N/A 08/04/2018   Procedure: Microlumbar decompression Lumbar three-four, Revision Lumbar four-five;  Surgeon: Duwayne Purchase, MD;  Location: [facility] OR;  Service: Orthopedics;  Laterality: N/A;   TONSILLECTOMY     WISDOM TOOTH EXTRACTION      OB History   No obstetric history on file.      Home Medications    Prior to Admission medications   Medication Sig Start Date End Date Taking?  Authorizing Provider  albuterol  (VENTOLIN  HFA) 108 (90 Base) MCG/ACT inhaler Inhale 1 puff into the lungs every 6 (six) hours as needed for wheezing or shortness of breath. 06/02/22   Paseda, Folashade R, FNP  buPROPion  (WELLBUTRIN  XL) 150 MG 24 hr tablet TAKE 1 TABLET(150 MG) BY MOUTH EVERY MORNING 09/18/24   Del Orbe Polanco, Hilario, FNP  hydrOXYzine  (VISTARIL ) 25 MG capsule TAKE 1 CAPSULE(25 MG) BY MOUTH EVERY 8 HOURS AS NEEDED 09/11/24   Bevely Doffing, FNP  methocarbamol  (ROBAXIN ) 500 MG tablet TAKE 1 TABLET(500 MG) BY MOUTH EVERY 8 HOURS AS NEEDED FOR MUSCLE SPASMS 05/02/24   Del Wilhelmena Falter, Hilario, FNP  olmesartan  (BENICAR ) 20 MG tablet TAKE 1/2 TABLET(10 MG) BY MOUTH DAILY 06/05/24   Del Wilhelmena Falter, Hilario, FNP  omeprazole  (PRILOSEC) 40 MG capsule TAKE 1 CAPSULE(40 MG) BY MOUTH DAILY 09/06/24   Del Wilhelmena Falter, Hilario, FNP  OZEMPIC , 2 MG/DOSE, 8 MG/3ML SOPN INJECT 2MG  ONCE A WEEK AS DIRECTED 08/22/24   Del Wilhelmena Falter Hilario, FNP  PARoxetine  (PAXIL ) 10 MG tablet TAKE 1 TABLET(10 MG) BY MOUTH AT BEDTIME 09/11/24   Bevely Doffing, FNP    Family History Family History  Problem Relation Age of Onset   Anxiety disorder Mother    Diabetes type II Mother    Diabetes Mellitus II Father    Hypertension Father    Anxiety disorder Brother    Lupus Maternal Aunt  Anxiety disorder Maternal Grandmother    Depression Maternal Grandmother    Cervical cancer Maternal Grandmother    COPD Maternal Grandfather    Liver cancer Maternal Grandfather    Melanoma Maternal Grandfather    Anxiety disorder Paternal Grandmother    Depression Paternal Grandmother        cancer on his lymph node   Breast cancer Neg Hx     Social History Social History   Tobacco Use   Smoking status: Every Day    Current packs/day: 0.50    Average packs/day: 0.5 packs/day for 15.0 years (7.5 ttl pk-yrs)    Types: Cigarettes   Smokeless tobacco: Never   Tobacco comments:    She smokes 6-7 cigarettes daily.    Substance Use Topics   Alcohol use: Yes    Comment: occ   Drug use: Yes    Types: Marijuana    Comment: 2 joints a week     Allergies   Penicillins   Review of Systems Review of Systems Per HPI  Physical Exam Triage Vital Signs ED Triage Vitals  Encounter Vitals Group     BP 09/21/24 1227 121/76     Girls Systolic BP Percentile --      Girls Diastolic BP Percentile --      Boys Systolic BP Percentile --      Boys Diastolic BP Percentile --      Pulse Rate 09/21/24 1227 88     Resp 09/21/24 1227 18     Temp 09/21/24 1227 98.6 F (37 C)     Temp Source 09/21/24 1227 Oral     SpO2 09/21/24 1227 96 %     Weight --      Height --      Head Circumference --      Peak Flow --      Pain Score 09/21/24 1228 8     Pain Loc --      Pain Education --      Exclude from Growth Chart --    No data found.  Updated Vital Signs BP 121/76 (BP Location: Right Arm)   Pulse 88   Temp 98.6 F (37 C) (Oral)   Resp 18   LMP 09/14/2024 (Approximate)   SpO2 96%   Visual Acuity Right Eye Distance:   Left Eye Distance:   Bilateral Distance:    Right Eye Near:   Left Eye Near:    Bilateral Near:     Physical Exam Vitals and nursing note reviewed.  Constitutional:      General: She is not in acute distress.    Appearance: Normal appearance. She is not toxic-appearing.  HENT:     Right Ear: External ear normal.     Left Ear: External ear normal.     Mouth/Throat:     Mouth: Mucous membranes are moist.     Pharynx: Oropharynx is clear. No posterior oropharyngeal erythema.  Cardiovascular:     Rate and Rhythm: Normal rate and regular rhythm.  Pulmonary:     Effort: Pulmonary effort is normal. No respiratory distress.     Breath sounds: Normal breath sounds. No wheezing, rhonchi or rales.  Abdominal:     General: Abdomen is flat. Bowel sounds are normal. There is no distension.     Palpations: Abdomen is soft.     Tenderness: There is no abdominal tenderness. There is  no right CVA tenderness, left CVA tenderness or guarding.  Musculoskeletal:     Cervical  back: Normal range of motion.  Lymphadenopathy:     Cervical: No cervical adenopathy.  Skin:    General: Skin is warm and dry.     Capillary Refill: Capillary refill takes less than 2 seconds.     Coloration: Skin is not jaundiced or pale.     Findings: No erythema.  Neurological:     Mental Status: She is alert and oriented to person, place, and time.  Psychiatric:        Behavior: Behavior is cooperative.      UC Treatments / Results  Labs (all labs ordered are listed, but only abnormal results are displayed) Labs Reviewed  POC COVID19/FLU A&B COMBO    EKG   Radiology No results found.  Procedures Procedures (including critical care time)  Medications Ordered in UC Medications  ondansetron  (ZOFRAN -ODT) disintegrating tablet 4 mg (4 mg Oral Given 09/21/24 1303)    Initial Impression / Assessment and Plan / UC Course  I have reviewed the triage vital signs and the nursing notes.  Pertinent labs & imaging results that were available during my care of the patient were reviewed by me and considered in my medical decision making (see chart for details).   Patient is well-appearing, normotensive, afebrile, not tachycardic, not tachypneic, oxygenating well on room air.   1. Nausea and vomiting, unspecified vomiting type 2. Diarrhea, unspecified type Vitals and exam are stable today COVID-19, influenza testing is negative Suspect viral gastroenteritis Zofran  4 mg ODT given in urgent care today and patient able to tolerate liquids shortly thereafter Start Zofran  at home every 8 hours as needed, push hydration Strict ER and return precautions discussed with patient Work excuse provided  The patient was given the opportunity to ask questions.  All questions answered to their satisfaction.  The patient is in agreement to this plan.   Final Clinical Impressions(s) / UC Diagnoses    Final diagnoses:  Nausea and vomiting, unspecified vomiting type  Diarrhea, unspecified type     Discharge Instructions      As we discussed, your symptoms are consistent with a viral stomach bug.  COVID-19 and influenza testing is negative today.  We gave you Zofran  today to help with the nausea.  You can continue Zofran  at home every 8 hours to prevent vomiting.  Please push plenty of fluids at home. Symptoms should improve over the next few days.  If symptoms worsen and you develop severe abdominal pain or nausea/vomiting and are unable to keep fluids down, please seek care in the ER.     ED Prescriptions   None    PDMP not reviewed this encounter.   Chandra Harlene LABOR, NP 09/21/24 1341

## 2024-09-28 ENCOUNTER — Other Ambulatory Visit: Payer: Self-pay | Admitting: Family Medicine

## 2024-09-28 DIAGNOSIS — I10 Essential (primary) hypertension: Secondary | ICD-10-CM

## 2024-10-17 ENCOUNTER — Encounter: Payer: Self-pay | Admitting: Internal Medicine

## 2024-10-17 ENCOUNTER — Ambulatory Visit (INDEPENDENT_AMBULATORY_CARE_PROVIDER_SITE_OTHER): Admitting: Internal Medicine

## 2024-10-17 VITALS — BP 126/84 | HR 78 | Ht 62.5 in | Wt 181.4 lb

## 2024-10-17 DIAGNOSIS — I10 Essential (primary) hypertension: Secondary | ICD-10-CM | POA: Diagnosis not present

## 2024-10-17 DIAGNOSIS — Z9889 Other specified postprocedural states: Secondary | ICD-10-CM | POA: Diagnosis not present

## 2024-10-17 DIAGNOSIS — M545 Low back pain, unspecified: Secondary | ICD-10-CM | POA: Diagnosis not present

## 2024-10-17 DIAGNOSIS — Z7985 Long-term (current) use of injectable non-insulin antidiabetic drugs: Secondary | ICD-10-CM

## 2024-10-17 DIAGNOSIS — E782 Mixed hyperlipidemia: Secondary | ICD-10-CM

## 2024-10-17 DIAGNOSIS — E1169 Type 2 diabetes mellitus with other specified complication: Secondary | ICD-10-CM

## 2024-10-17 MED ORDER — PREDNISONE 10 MG (21) PO TBPK: ORAL_TABLET | ORAL | 0 refills | Status: AC

## 2024-10-17 NOTE — Patient Instructions (Signed)
Please start taking Prednisone as prescribed.  Please take Robaxin as needed for muscle spasms.  Please avoid heavy lifting and frequent bending.

## 2024-10-17 NOTE — Assessment & Plan Note (Signed)
 BP Readings from Last 1 Encounters:  10/17/24 126/84   Well-controlled with olmesartan  10 mg QD Check CMP Counseled for compliance with the medications Advised DASH diet and moderate exercise/walking, at least 150 mins/week

## 2024-10-17 NOTE — Assessment & Plan Note (Signed)
 Acute low back pain likely due to muscular strain Has a history of herniated lumbar disc and DDD of lumbar spine, s/p lumbar laminectomy in 2019 Used to be followed by Dr. Duwayne at [facility] Robaxin  as needed for muscle spasms Sterapred taper prescribed Advised to apply heating pad and/or back brace as needed Avoid heavy lifting and frequent bending

## 2024-10-17 NOTE — Assessment & Plan Note (Addendum)
 Lab Results  Component Value Date   HGBA1C 6.6 (H) 07/21/2023   Well-controlled, did not tolerate metformin  Associated with HTN On Ozempic  2 mg qw Advised to follow diabetic diet On ARB now F/u CMP and lipid panel Diabetic eye exam: Advised to follow up with Ophthalmology for diabetic eye exam

## 2024-10-17 NOTE — Assessment & Plan Note (Signed)
 Robaxin  as needed for muscle spasms If persistent or worsening pain, may need to see spine specialist

## 2024-10-17 NOTE — Progress Notes (Signed)
 Acute Office Visit  Subjective:    Patient ID: Amanda Morales, female    DOB: 1986-03-19, 38 y.o.   MRN: [id number]  Chief Complaint  Patient presents with   Back Pain    Pt reports sx of low back pain on right side radiating down her leg. Reports always having back problems , flare up started 2 days ago.    HPI Patient is in today for complaint of acute on chronic low back pain for the last 2 days.  Her pain is constant, sharp, radiating towards RLE and worse with movement.  She has chronic, intermittent numbness of the RLE.  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  Denies any recent injury or fall.  She has history of herniated lumbar disc and DDD of lumbar spine, has had lumbar laminectomy in 2019 with Dr Duwayne at [facility].  Type II DM: Her last HbA1c was 6.6 in 08/24.  She has not had any blood test since then.  She is on Ozempic  2 mg QW currently, has lost about 30 lbs sinc starting it in 03/25.  She is tolerating it well.  She checks her blood glucose regularly, and reports them being around 80-120.  Denies polyuria or polyphasia currently.  She is also trying to follow low-carb diet.  Past Medical History:  Diagnosis Date   Anxiety    Asthma    as a child   Depression    GERD (gastroesophageal reflux disease)     Past Surgical History:  Procedure Laterality Date   BACK SURGERY     LUMBAR LAMINECTOMY/DECOMPRESSION MICRODISCECTOMY N/A 08/04/2018   Procedure: Microlumbar decompression Lumbar three-four, Revision Lumbar four-five;  Surgeon: Duwayne Purchase, MD;  Location: [facility] OR;  Service: Orthopedics;  Laterality: N/A;   TONSILLECTOMY     WISDOM TOOTH EXTRACTION      Family History  Problem Relation Age of Onset   Anxiety disorder Mother    Diabetes type II Mother    Diabetes Mellitus II Father    Hypertension Father    Anxiety disorder Brother    Lupus Maternal Aunt    Anxiety disorder Maternal Grandmother     Depression Maternal Grandmother    Cervical cancer Maternal Grandmother    COPD Maternal Grandfather    Liver cancer Maternal Grandfather    Melanoma Maternal Grandfather    Anxiety disorder Paternal Grandmother    Depression Paternal Grandmother        cancer on his lymph node   Breast cancer Neg Hx     Social History   Socioeconomic History   Marital status: Single    Spouse name: Not on file   Number of children: Not on file   Years of education: Not on file   Highest education level: 9th grade  Occupational History   Not on file  Tobacco Use   Smoking status: Every Day    Current packs/day: 0.50    Average packs/day: 0.5 packs/day for 15.0 years (7.5 ttl pk-yrs)    Types: Cigarettes   Smokeless tobacco: Never   Tobacco comments:    She smokes 6-7 cigarettes daily.   Vaping Use   Vaping status: Not on file  Substance and Sexual Activity   Alcohol use: Yes    Comment: occ   Drug use: Yes    Types: Marijuana    Comment: 2 joints a week   Sexual activity: Yes    Birth  control/protection: None  Other Topics Concern   Not on file  Social History Narrative   Lives alone.    Social Drivers of Health   Financial Resource Strain: Medium Risk (02/09/2024)   Overall Financial Resource Strain (CARDIA)    Difficulty of Paying Living Expenses: Somewhat hard  Food Insecurity: Food Insecurity Present (02/09/2024)   Hunger Vital Sign    Worried About Running Out of Food in the Last Year: Sometimes true    Ran Out of Food in the Last Year: Sometimes true  Transportation Needs: No Transportation Needs (02/09/2024)   PRAPARE - Administrator, Civil Service (Medical): No    Lack of Transportation (Non-Medical): No  Physical Activity: Unknown (02/09/2024)   Exercise Vital Sign    Days of Exercise per Week: 0 days    Minutes of Exercise per Session: Not on file  Stress: Stress Concern Present (02/09/2024)   [employer] of Occupational Health - Occupational Stress  Questionnaire    Feeling of Stress : Very much  Social Connections: Unknown (02/09/2024)   Social Connection and Isolation Panel    Frequency of Communication with Friends and Family: More than three times a week    Frequency of Social Gatherings with Friends and Family: Patient declined    Attends Religious Services: Never    Database Administrator or Organizations: No    Attends Engineer, Structural: Not on file    Marital Status: Patient declined  Catering Manager Violence: Not on file    Outpatient Medications Prior to Visit  Medication Sig Dispense Refill   albuterol  (VENTOLIN  HFA) 108 (90 Base) MCG/ACT inhaler Inhale 1 puff into the lungs every 6 (six) hours as needed for wheezing or shortness of breath. 1 each 3   buPROPion  (WELLBUTRIN  XL) 150 MG 24 hr tablet TAKE 1 TABLET(150 MG) BY MOUTH EVERY MORNING 90 tablet 0   hydrOXYzine  (VISTARIL ) 25 MG capsule TAKE 1 CAPSULE(25 MG) BY MOUTH EVERY 8 HOURS AS NEEDED 30 capsule 2   methocarbamol  (ROBAXIN ) 500 MG tablet TAKE 1 TABLET(500 MG) BY MOUTH EVERY 8 HOURS AS NEEDED FOR MUSCLE SPASMS 30 tablet 2   olmesartan  (BENICAR ) 20 MG tablet TAKE 1/2 TABLET(10 MG) BY MOUTH DAILY 30 tablet 1   omeprazole  (PRILOSEC) 40 MG capsule TAKE 1 CAPSULE(40 MG) BY MOUTH DAILY 30 capsule 3   OZEMPIC , 2 MG/DOSE, 8 MG/3ML SOPN INJECT 2MG  ONCE A WEEK AS DIRECTED 3 mL 2   PARoxetine  (PAXIL ) 10 MG tablet TAKE 1 TABLET(10 MG) BY MOUTH AT BEDTIME 30 tablet 3   No facility-administered medications prior to visit.    Allergies  Allergen Reactions   Penicillins Hives, Rash and Other (See Comments)    Has patient had a PCN reaction causing immediate rash, facial/tongue/throat swelling, SOB or lightheadedness with hypotension: unkn Has patient had a PCN reaction causing severe rash involving mucus membranes or skin necrosis: unkn PATIENT HAS HAD A PCN REACTION THAT REQUIRED HOSPITALIZATION:  #  #  YES  #  #  Has patient had a PCN reaction occurring within  the last 10 years: #  #  #  YES  #  #  #  If all of the above answers are NO, then may proceed with Cephalosporin use.     Review of Systems  Constitutional:  Negative for chills and fever.  HENT:  Negative for congestion, sinus pressure, sinus pain and sore throat.   Eyes:  Negative for pain and discharge.  Respiratory:  Negative for cough and shortness of breath.   Cardiovascular:  Negative for chest pain and palpitations.  Gastrointestinal:  Negative for abdominal pain, diarrhea, nausea and vomiting.  Endocrine: Negative for polydipsia and polyuria.  Genitourinary:  Negative for dysuria and hematuria.  Musculoskeletal:  Positive for back pain. Negative for neck pain and neck stiffness.  Skin:  Negative for rash.  Neurological:  Positive for numbness (RLE). Negative for dizziness and weakness.  Psychiatric/Behavioral:  Negative for agitation and behavioral problems.        Objective:    Physical Exam Vitals reviewed.  Constitutional:      General: She is not in acute distress.    Appearance: She is not diaphoretic.  HENT:     Head: Normocephalic and atraumatic.     Nose: Nose normal.     Mouth/Throat:     Mouth: Mucous membranes are moist.  Eyes:     General: No scleral icterus.    Extraocular Movements: Extraocular movements intact.  Cardiovascular:     Rate and Rhythm: Normal rate and regular rhythm.     Heart sounds: Normal heart sounds. No murmur heard. Pulmonary:     Breath sounds: Normal breath sounds. No wheezing or rales.  Musculoskeletal:     Cervical back: Neck supple. No tenderness.     Lumbar back: Tenderness present. Decreased range of motion. Positive right straight leg raise test. Negative left straight leg raise test.     Right lower leg: No edema.     Left lower leg: No edema.  Skin:    General: Skin is warm.     Findings: No rash.  Neurological:     General: No focal deficit present.     Mental Status: She is alert and oriented to person,  place, and time.  Psychiatric:        Mood and Affect: Mood normal.        Behavior: Behavior normal.     BP 126/84   Pulse 78   Ht 5' 2.5 (1.588 m)   Wt 181 lb 6.4 oz (82.3 kg)   LMP 09/14/2024 (Approximate)   SpO2 99%   BMI 32.65 kg/m  Wt Readings from Last 3 Encounters:  10/17/24 181 lb 6.4 oz (82.3 kg)  07/11/24 186 lb 1.9 oz (84.4 kg)  02/09/24 211 lb 9.6 oz (96 kg)        Assessment & Plan:   Problem List Items Addressed This Visit       Cardiovascular and Mediastinum   Essential hypertension   BP Readings from Last 1 Encounters:  10/17/24 126/84   Well-controlled with olmesartan  10 mg QD Check CMP Counseled for compliance with the medications Advised DASH diet and moderate exercise/walking, at least 150 mins/week         Endocrine   Type 2 diabetes mellitus with other specified complication (HCC)   Lab Results  Component Value Date   HGBA1C 6.6 (H) 07/21/2023   Well-controlled, did not tolerate metformin  Associated with HTN On Ozempic  2 mg qw Advised to follow diabetic diet On ARB now F/u CMP and lipid panel Diabetic eye exam: Advised to follow up with Ophthalmology for diabetic eye exam      Relevant Orders   CMP14+EGFR   Hemoglobin A1c   Urine Microalbumin w/creat. ratio     Other   Acute right-sided low back pain - Primary   Acute low back pain likely due to muscular strain Has a history of herniated lumbar disc and DDD  of lumbar spine, s/p lumbar laminectomy in 2019 Used to be followed by Dr. Duwayne at [facility] Robaxin  as needed for muscle spasms Sterapred taper prescribed Advised to apply heating pad and/or back brace as needed Avoid heavy lifting and frequent bending      Relevant Medications   predniSONE  (STERAPRED UNI-PAK 21 TAB) 10 MG (21) TBPK tablet   S/P lumbar laminectomy   Robaxin  as needed for muscle spasms If persistent or worsening pain, may need to see spine specialist      Relevant Medications    predniSONE  (STERAPRED UNI-PAK 21 TAB) 10 MG (21) TBPK tablet   Other Visit Diagnoses       Mixed hyperlipidemia       Relevant Orders   Lipid Profile        Meds ordered this encounter  Medications   predniSONE  (STERAPRED UNI-PAK 21 TAB) 10 MG (21) TBPK tablet    Sig: Take as package instructions.    Dispense:  1 each    Refill:  0     Yarielis Funaro MARLA Blanch, MD

## 2024-10-18 ENCOUNTER — Ambulatory Visit: Payer: Self-pay | Admitting: Internal Medicine

## 2024-10-18 LAB — LIPID PANEL
Chol/HDL Ratio: 3 ratio (ref 0.0–4.4)
Cholesterol, Total: 147 mg/dL (ref 100–199)
HDL: 49 mg/dL (ref >39)
LDL Chol Calc (NIH): 63 mg/dL (ref 0–99)
Triglycerides: 218 mg/dL — ABNORMAL HIGH (ref 0–149)
VLDL Cholesterol Cal: 35 mg/dL (ref 5–40)

## 2024-10-18 LAB — CMP14+EGFR
ALT: 20 IU/L (ref 0–32)
AST: 18 IU/L (ref 0–40)
Albumin: 4.4 g/dL (ref 3.9–4.9)
Alkaline Phosphatase: 59 IU/L (ref 41–116)
BUN/Creatinine Ratio: 16 (ref 9–23)
BUN: 12 mg/dL (ref 6–20)
Bilirubin Total: 0.4 mg/dL (ref 0.0–1.2)
CO2: 22 mmol/L (ref 20–29)
Calcium: 9.2 mg/dL (ref 8.7–10.2)
Chloride: 101 mmol/L (ref 96–106)
Creatinine, Ser: 0.77 mg/dL (ref 0.57–1.00)
Globulin, Total: 2.2 g/dL (ref 1.5–4.5)
Glucose: 104 mg/dL — ABNORMAL HIGH (ref 70–99)
Potassium: 4.2 mmol/L (ref 3.5–5.2)
Sodium: 138 mmol/L (ref 134–144)
Total Protein: 6.6 g/dL (ref 6.0–8.5)
eGFR: 101 mL/min/1.73 (ref >59)

## 2024-10-18 LAB — HEMOGLOBIN A1C
Est. average glucose Bld gHb Est-mCnc: 117 mg/dL
Hgb A1c MFr Bld: 5.7 % — ABNORMAL HIGH (ref 4.8–5.6)

## 2024-10-19 LAB — MICROALBUMIN / CREATININE URINE RATIO
Creatinine, Urine: 145.3 mg/dL
Microalb/Creat Ratio: 2 mg/g{creat} (ref 0–29)
Microalbumin, Urine: 3.6 ug/mL

## 2024-10-29 ENCOUNTER — Other Ambulatory Visit: Payer: Self-pay | Admitting: Family Medicine

## 2024-10-30 ENCOUNTER — Encounter: Payer: Self-pay | Admitting: Internal Medicine

## 2024-10-30 ENCOUNTER — Other Ambulatory Visit: Payer: Self-pay | Admitting: Internal Medicine

## 2024-10-30 DIAGNOSIS — F419 Anxiety disorder, unspecified: Secondary | ICD-10-CM

## 2024-10-30 MED ORDER — HYDROXYZINE PAMOATE 25 MG PO CAPS: 25.0000 mg | ORAL_CAPSULE | Freq: Three times a day (TID) | ORAL | 2 refills | Status: DC | PRN

## 2024-10-31 ENCOUNTER — Other Ambulatory Visit: Payer: Self-pay | Admitting: Family Medicine

## 2024-10-31 DIAGNOSIS — R252 Cramp and spasm: Secondary | ICD-10-CM

## 2024-11-17 ENCOUNTER — Other Ambulatory Visit: Payer: Self-pay | Admitting: Internal Medicine

## 2024-11-17 DIAGNOSIS — E1169 Type 2 diabetes mellitus with other specified complication: Secondary | ICD-10-CM

## 2024-11-17 MED ORDER — OZEMPIC (2 MG/DOSE) 8 MG/3ML ~~LOC~~ SOPN: 2.0000 mg | PEN_INJECTOR | SUBCUTANEOUS | 5 refills | Status: DC

## 2024-12-05 ENCOUNTER — Other Ambulatory Visit: Payer: Self-pay

## 2024-12-05 DIAGNOSIS — F419 Anxiety disorder, unspecified: Secondary | ICD-10-CM

## 2024-12-22 ENCOUNTER — Other Ambulatory Visit: Payer: Self-pay | Admitting: Internal Medicine

## 2024-12-22 DIAGNOSIS — E1169 Type 2 diabetes mellitus with other specified complication: Secondary | ICD-10-CM

## 2025-01-11 ENCOUNTER — Other Ambulatory Visit: Payer: Self-pay | Admitting: Family Medicine

## 2025-01-11 ENCOUNTER — Other Ambulatory Visit (HOSPITAL_COMMUNITY): Payer: Self-pay

## 2025-01-11 ENCOUNTER — Telehealth: Payer: Self-pay

## 2025-01-11 DIAGNOSIS — I10 Essential (primary) hypertension: Secondary | ICD-10-CM

## 2025-01-11 DIAGNOSIS — R252 Cramp and spasm: Secondary | ICD-10-CM

## 2025-01-11 DIAGNOSIS — K219 Gastro-esophageal reflux disease without esophagitis: Secondary | ICD-10-CM

## 2025-01-11 NOTE — Telephone Encounter (Signed)
 Pharmacy Patient Advocate Encounter   Received notification from [facility] that prior authorization for OZEMPIC  2MG  is required/requested.   Insurance verification completed.   The patient is insured through [employer].   Per test claim: Refill too soon. PA is not needed at this time. Medication was filled 12/22/2024. Next eligible fill date is 01/22/2025.
